# Patient Record
Sex: Female | Born: 1945 | Race: White | Hispanic: No | Marital: Single | Smoking: Current every day smoker
Health system: Southern US, Community
[De-identification: ages and names within clinical notes are randomized; demographics above are authoritative.]

## PROBLEM LIST (undated history)

## (undated) DIAGNOSIS — J449 Chronic obstructive pulmonary disease, unspecified: Secondary | ICD-10-CM

## (undated) DIAGNOSIS — Z8739 Personal history of other diseases of the musculoskeletal system and connective tissue: Secondary | ICD-10-CM

## (undated) DIAGNOSIS — Z9889 Other specified postprocedural states: Secondary | ICD-10-CM

## (undated) DIAGNOSIS — E119 Type 2 diabetes mellitus without complications: Secondary | ICD-10-CM

## (undated) DIAGNOSIS — C801 Malignant (primary) neoplasm, unspecified: Secondary | ICD-10-CM

---

## 2009-07-04 ENCOUNTER — Inpatient Hospital Stay (HOSPITAL_COMMUNITY): Admission: EM | Admit: 2009-07-04 | Discharge: 2009-07-07 | Payer: Self-pay | Admitting: Emergency Medicine

## 2009-07-06 ENCOUNTER — Encounter (INDEPENDENT_AMBULATORY_CARE_PROVIDER_SITE_OTHER): Payer: Self-pay | Admitting: Internal Medicine

## 2009-07-06 ENCOUNTER — Ambulatory Visit: Payer: Self-pay | Admitting: Vascular Surgery

## 2010-11-17 LAB — DIFFERENTIAL
Eosinophils Relative: 2 % (ref 0–5)
Lymphocytes Relative: 38 % (ref 12–46)
Monocytes Absolute: 0.7 10*3/uL (ref 0.1–1.0)
Monocytes Relative: 9 % (ref 3–12)
Neutro Abs: 4.1 10*3/uL (ref 1.7–7.7)

## 2010-11-17 LAB — COMPREHENSIVE METABOLIC PANEL
AST: 22 U/L (ref 0–37)
Albumin: 3.7 g/dL (ref 3.5–5.2)
BUN: 17 mg/dL (ref 6–23)
Creatinine, Ser: 1.19 mg/dL (ref 0.4–1.2)
GFR calc Af Amer: 56 mL/min — ABNORMAL LOW (ref >60)
Potassium: 3.7 mEq/L (ref 3.5–5.1)
Total Protein: 6.3 g/dL (ref 6.0–8.3)

## 2010-11-17 LAB — CBC
HCT: 37.1 % (ref 36.0–46.0)
Hemoglobin: 12.9 g/dL (ref 12.0–15.0)
MCHC: 34.3 g/dL (ref 30.0–36.0)
MCV: 93 fL (ref 78.0–100.0)
RDW: 12.6 % (ref 11.5–15.5)
RDW: 12.8 % (ref 11.5–15.5)
WBC: 8 10*3/uL (ref 4.0–10.5)

## 2010-11-17 LAB — GLUCOSE, CAPILLARY
Glucose-Capillary: 105 mg/dL — ABNORMAL HIGH (ref 70–99)
Glucose-Capillary: 106 mg/dL — ABNORMAL HIGH (ref 70–99)
Glucose-Capillary: 115 mg/dL — ABNORMAL HIGH (ref 70–99)
Glucose-Capillary: 85 mg/dL (ref 70–99)
Glucose-Capillary: 97 mg/dL (ref 70–99)

## 2010-11-17 LAB — HEPATIC FUNCTION PANEL
AST: 19 U/L (ref 0–37)
Albumin: 3.1 g/dL — ABNORMAL LOW (ref 3.5–5.2)
Alkaline Phosphatase: 69 U/L (ref 39–117)
Bilirubin, Direct: <0.1 mg/dL (ref 0.0–0.3)
Total Bilirubin: 0.5 mg/dL (ref 0.3–1.2)

## 2010-11-17 LAB — POCT I-STAT, CHEM 8
Chloride: 105 mEq/L (ref 96–112)
Hemoglobin: 13.3 g/dL (ref 12.0–15.0)
Potassium: 3.6 mEq/L (ref 3.5–5.1)
Sodium: 143 mEq/L (ref 135–145)

## 2010-11-17 LAB — CARDIAC PANEL(CRET KIN+CKTOT+MB+TROPI)
CK, MB: 2.3 ng/mL (ref 0.3–4.0)
Total CK: 98 U/L (ref 7–177)
Troponin I: 0.01 ng/mL (ref 0.00–0.06)
Troponin I: 0.03 ng/mL (ref 0.00–0.06)

## 2010-11-17 LAB — LIPID PANEL
Cholesterol: 183 mg/dL (ref 0–200)
LDL Cholesterol: 110 mg/dL — ABNORMAL HIGH (ref 0–99)
Total CHOL/HDL Ratio: 4.4 RATIO
Triglycerides: 157 mg/dL — ABNORMAL HIGH (ref <150)

## 2010-11-17 LAB — BASIC METABOLIC PANEL
CO2: 25 mEq/L (ref 19–32)
Calcium: 8.1 mg/dL — ABNORMAL LOW (ref 8.4–10.5)
Chloride: 106 mEq/L (ref 96–112)
Chloride: 112 mEq/L (ref 96–112)
Creatinine, Ser: 1.13 mg/dL (ref 0.4–1.2)
GFR calc Af Amer: >60 mL/min (ref >60)
Glucose, Bld: 105 mg/dL — ABNORMAL HIGH (ref 70–99)
Potassium: 3.8 mEq/L (ref 3.5–5.1)

## 2010-11-17 LAB — CK TOTAL AND CKMB (NOT AT ARMC)
CK, MB: 2.6 ng/mL (ref 0.3–4.0)
Relative Index: 2 (ref 0.0–2.5)
Relative Index: INVALID (ref 0.0–2.5)
Total CK: 97 U/L (ref 7–177)

## 2010-11-17 LAB — TROPONIN I: Troponin I: 0.02 ng/mL (ref 0.00–0.06)

## 2010-11-17 LAB — APTT: aPTT: 28 seconds (ref 24–37)

## 2015-12-12 ENCOUNTER — Inpatient Hospital Stay (HOSPITAL_COMMUNITY): Payer: Medicare Other | Admitting: Anesthesiology

## 2015-12-12 ENCOUNTER — Emergency Department (HOSPITAL_COMMUNITY): Payer: Medicare Other

## 2015-12-12 ENCOUNTER — Encounter (HOSPITAL_COMMUNITY): Payer: Self-pay | Admitting: General Surgery

## 2015-12-12 ENCOUNTER — Inpatient Hospital Stay (HOSPITAL_COMMUNITY)
Admission: EM | Admit: 2015-12-12 | Discharge: 2016-01-14 | DRG: 853 | Disposition: E | Payer: Medicare Other | Attending: Pulmonary Disease | Admitting: Pulmonary Disease

## 2015-12-12 ENCOUNTER — Inpatient Hospital Stay (HOSPITAL_COMMUNITY): Payer: Medicare Other

## 2015-12-12 ENCOUNTER — Encounter (HOSPITAL_COMMUNITY): Admission: EM | Disposition: E | Payer: Self-pay | Source: Home / Self Care | Attending: Pulmonary Disease

## 2015-12-12 DIAGNOSIS — E11649 Type 2 diabetes mellitus with hypoglycemia without coma: Secondary | ICD-10-CM | POA: Diagnosis not present

## 2015-12-12 DIAGNOSIS — Z66 Do not resuscitate: Secondary | ICD-10-CM | POA: Diagnosis not present

## 2015-12-12 DIAGNOSIS — J449 Chronic obstructive pulmonary disease, unspecified: Secondary | ICD-10-CM | POA: Diagnosis present

## 2015-12-12 DIAGNOSIS — J9811 Atelectasis: Secondary | ICD-10-CM | POA: Diagnosis not present

## 2015-12-12 DIAGNOSIS — R109 Unspecified abdominal pain: Secondary | ICD-10-CM | POA: Diagnosis present

## 2015-12-12 DIAGNOSIS — N179 Acute kidney failure, unspecified: Secondary | ICD-10-CM | POA: Insufficient documentation

## 2015-12-12 DIAGNOSIS — E876 Hypokalemia: Secondary | ICD-10-CM | POA: Diagnosis present

## 2015-12-12 DIAGNOSIS — E860 Dehydration: Secondary | ICD-10-CM | POA: Diagnosis present

## 2015-12-12 DIAGNOSIS — R160 Hepatomegaly, not elsewhere classified: Secondary | ICD-10-CM | POA: Diagnosis present

## 2015-12-12 DIAGNOSIS — M549 Dorsalgia, unspecified: Secondary | ICD-10-CM

## 2015-12-12 DIAGNOSIS — Z86018 Personal history of other benign neoplasm: Secondary | ICD-10-CM

## 2015-12-12 DIAGNOSIS — R34 Anuria and oliguria: Secondary | ICD-10-CM | POA: Diagnosis not present

## 2015-12-12 DIAGNOSIS — K7200 Acute and subacute hepatic failure without coma: Secondary | ICD-10-CM | POA: Diagnosis present

## 2015-12-12 DIAGNOSIS — K659 Peritonitis, unspecified: Secondary | ICD-10-CM | POA: Diagnosis present

## 2015-12-12 DIAGNOSIS — R6521 Severe sepsis with septic shock: Secondary | ICD-10-CM | POA: Diagnosis present

## 2015-12-12 DIAGNOSIS — K7290 Hepatic failure, unspecified without coma: Secondary | ICD-10-CM | POA: Diagnosis not present

## 2015-12-12 DIAGNOSIS — I248 Other forms of acute ischemic heart disease: Secondary | ICD-10-CM | POA: Diagnosis present

## 2015-12-12 DIAGNOSIS — I10 Essential (primary) hypertension: Secondary | ICD-10-CM | POA: Diagnosis present

## 2015-12-12 DIAGNOSIS — F17200 Nicotine dependence, unspecified, uncomplicated: Secondary | ICD-10-CM | POA: Diagnosis present

## 2015-12-12 DIAGNOSIS — A419 Sepsis, unspecified organism: Principal | ICD-10-CM | POA: Diagnosis present

## 2015-12-12 DIAGNOSIS — Z880 Allergy status to penicillin: Secondary | ICD-10-CM

## 2015-12-12 DIAGNOSIS — J432 Centrilobular emphysema: Secondary | ICD-10-CM | POA: Diagnosis not present

## 2015-12-12 DIAGNOSIS — Z9889 Other specified postprocedural states: Secondary | ICD-10-CM | POA: Diagnosis not present

## 2015-12-12 DIAGNOSIS — D649 Anemia, unspecified: Secondary | ICD-10-CM | POA: Diagnosis present

## 2015-12-12 DIAGNOSIS — K5669 Other intestinal obstruction: Secondary | ICD-10-CM

## 2015-12-12 DIAGNOSIS — J9601 Acute respiratory failure with hypoxia: Secondary | ICD-10-CM | POA: Insufficient documentation

## 2015-12-12 DIAGNOSIS — K56609 Unspecified intestinal obstruction, unspecified as to partial versus complete obstruction: Secondary | ICD-10-CM | POA: Insufficient documentation

## 2015-12-12 DIAGNOSIS — K55029 Acute infarction of small intestine, extent unspecified: Secondary | ICD-10-CM | POA: Diagnosis present

## 2015-12-12 DIAGNOSIS — Z01818 Encounter for other preprocedural examination: Secondary | ICD-10-CM

## 2015-12-12 DIAGNOSIS — Z515 Encounter for palliative care: Secondary | ICD-10-CM | POA: Diagnosis not present

## 2015-12-12 DIAGNOSIS — E861 Hypovolemia: Secondary | ICD-10-CM | POA: Diagnosis present

## 2015-12-12 DIAGNOSIS — E875 Hyperkalemia: Secondary | ICD-10-CM | POA: Diagnosis present

## 2015-12-12 DIAGNOSIS — Z8739 Personal history of other diseases of the musculoskeletal system and connective tissue: Secondary | ICD-10-CM

## 2015-12-12 DIAGNOSIS — K565 Intestinal adhesions [bands] with obstruction (postprocedural) (postinfection): Secondary | ICD-10-CM | POA: Diagnosis present

## 2015-12-12 DIAGNOSIS — Z452 Encounter for adjustment and management of vascular access device: Secondary | ICD-10-CM

## 2015-12-12 DIAGNOSIS — M314 Aortic arch syndrome [Takayasu]: Secondary | ICD-10-CM

## 2015-12-12 DIAGNOSIS — J9600 Acute respiratory failure, unspecified whether with hypoxia or hypercapnia: Secondary | ICD-10-CM

## 2015-12-12 DIAGNOSIS — E872 Acidosis, unspecified: Secondary | ICD-10-CM

## 2015-12-12 DIAGNOSIS — K912 Postsurgical malabsorption, not elsewhere classified: Secondary | ICD-10-CM | POA: Diagnosis not present

## 2015-12-12 DIAGNOSIS — R0989 Other specified symptoms and signs involving the circulatory and respiratory systems: Secondary | ICD-10-CM

## 2015-12-12 DIAGNOSIS — E119 Type 2 diabetes mellitus without complications: Secondary | ICD-10-CM

## 2015-12-12 DIAGNOSIS — J80 Acute respiratory distress syndrome: Secondary | ICD-10-CM | POA: Diagnosis not present

## 2015-12-12 DIAGNOSIS — Z8719 Personal history of other diseases of the digestive system: Secondary | ICD-10-CM

## 2015-12-12 DIAGNOSIS — C801 Malignant (primary) neoplasm, unspecified: Secondary | ICD-10-CM | POA: Diagnosis not present

## 2015-12-12 DIAGNOSIS — D6959 Other secondary thrombocytopenia: Secondary | ICD-10-CM | POA: Diagnosis not present

## 2015-12-12 DIAGNOSIS — K559 Vascular disorder of intestine, unspecified: Secondary | ICD-10-CM | POA: Diagnosis not present

## 2015-12-12 HISTORY — DX: Personal history of other benign neoplasm: Z86.018

## 2015-12-12 HISTORY — DX: Other specified postprocedural states: Z98.890

## 2015-12-12 HISTORY — DX: Type 2 diabetes mellitus without complications: E11.9

## 2015-12-12 HISTORY — DX: Personal history of other diseases of the musculoskeletal system and connective tissue: Z87.39

## 2015-12-12 HISTORY — DX: Malignant (primary) neoplasm, unspecified: C80.1

## 2015-12-12 HISTORY — DX: Chronic obstructive pulmonary disease, unspecified: J44.9

## 2015-12-12 HISTORY — PX: LAPAROTOMY: SHX154

## 2015-12-12 LAB — COMPREHENSIVE METABOLIC PANEL
ALK PHOS: 83 U/L (ref 38–126)
ALK PHOS: 91 U/L (ref 38–126)
ALT: 15 U/L (ref 14–54)
ALT: <5 U/L — ABNORMAL LOW (ref 14–54)
ANION GAP: 25 — AB (ref 5–15)
AST: 34 U/L (ref 15–41)
AST: 51 U/L — ABNORMAL HIGH (ref 15–41)
Albumin: 3.5 g/dL (ref 3.5–5.0)
Albumin: 3.1 g/dL — ABNORMAL LOW (ref 3.5–5.0)
Anion gap: 25 — ABNORMAL HIGH (ref 5–15)
BILIRUBIN TOTAL: 0.3 mg/dL (ref 0.3–1.2)
BUN: 32 mg/dL — ABNORMAL HIGH (ref 6–20)
BUN: 29 mg/dL — ABNORMAL HIGH (ref 6–20)
CALCIUM: 7.2 mg/dL — AB (ref 8.9–10.3)
CALCIUM: 8.4 mg/dL — AB (ref 8.9–10.3)
CHLORIDE: 111 mmol/L (ref 101–111)
CHLORIDE: 112 mmol/L — AB (ref 101–111)
CO2: 10 mmol/L — ABNORMAL LOW (ref 22–32)
CO2: 8 mmol/L — AB (ref 22–32)
CREATININE: 2.26 mg/dL — AB (ref 0.44–1.00)
Creatinine, Ser: 2.25 mg/dL — ABNORMAL HIGH (ref 0.44–1.00)
GFR calc non Af Amer: 21 mL/min — ABNORMAL LOW (ref >60)
GFR, EST AFRICAN AMERICAN: 24 mL/min — AB (ref >60)
GFR, EST AFRICAN AMERICAN: 24 mL/min — AB (ref >60)
GFR, EST NON AFRICAN AMERICAN: 21 mL/min — AB (ref >60)
Glucose, Bld: 281 mg/dL — ABNORMAL HIGH (ref 65–99)
Glucose, Bld: 297 mg/dL — ABNORMAL HIGH (ref 65–99)
Potassium: 3.4 mmol/L — ABNORMAL LOW (ref 3.5–5.1)
Potassium: 3.9 mmol/L (ref 3.5–5.1)
Sodium: 146 mmol/L — ABNORMAL HIGH (ref 135–145)
Sodium: 145 mmol/L (ref 135–145)
TOTAL PROTEIN: 6.5 g/dL (ref 6.5–8.1)
Total Bilirubin: 0.9 mg/dL (ref 0.3–1.2)
Total Protein: 5.7 g/dL — ABNORMAL LOW (ref 6.5–8.1)

## 2015-12-12 LAB — POCT I-STAT 3, ART BLOOD GAS (G3+)
ACID-BASE DEFICIT: 12 mmol/L — AB (ref 0.0–2.0)
ACID-BASE DEFICIT: 7 mmol/L — AB (ref 0.0–2.0)
Acid-base deficit: 13 mmol/L — ABNORMAL HIGH (ref 0.0–2.0)
Acid-base deficit: 11 mmol/L — ABNORMAL HIGH (ref 0.0–2.0)
BICARBONATE: 18.9 meq/L — AB (ref 20.0–24.0)
BICARBONATE: 17.8 meq/L — AB (ref 20.0–24.0)
BICARBONATE: 19.1 meq/L — AB (ref 20.0–24.0)
Bicarbonate: 17.1 mEq/L — ABNORMAL LOW (ref 20.0–24.0)
O2 SAT: 73 %
O2 SAT: 91 %
O2 SAT: 95 %
O2 Saturation: 98 %
PCO2 ART: 49 mmHg — AB (ref 35.0–45.0)
PCO2 ART: 37.9 mmHg (ref 35.0–45.0)
PH ART: 7.035 — AB (ref 7.350–7.450)
PH ART: 7.166 — AB (ref 7.350–7.450)
PH ART: 7.311 — AB (ref 7.350–7.450)
PO2 ART: 117 mmHg — AB (ref 80.0–100.0)
Patient temperature: 98.5
TCO2: 21 mmol/L (ref 0–100)
TCO2: 19 mmol/L (ref 0–100)
TCO2: 19 mmol/L (ref 0–100)
TCO2: 20 mmol/L (ref 0–100)
pCO2 arterial: 69.8 mmHg (ref 35.0–45.0)
pCO2 arterial: 44.8 mmHg (ref 35.0–45.0)
pH, Arterial: 7.174 — CL (ref 7.350–7.450)
pO2, Arterial: 54 mmHg — ABNORMAL LOW (ref 80.0–100.0)
pO2, Arterial: 68 mmHg — ABNORMAL LOW (ref 80.0–100.0)
pO2, Arterial: 97 mmHg (ref 80.0–100.0)

## 2015-12-12 LAB — POCT I-STAT, CHEM 8
BUN: 40 mg/dL — AB (ref 6–20)
CALCIUM ION: 0.93 mmol/L — AB (ref 1.13–1.30)
CHLORIDE: 117 mmol/L — AB (ref 101–111)
CREATININE: 1.5 mg/dL — AB (ref 0.44–1.00)
GLUCOSE: 113 mg/dL — AB (ref 65–99)
HCT: 46 % (ref 36.0–46.0)
Hemoglobin: 15.6 g/dL — ABNORMAL HIGH (ref 12.0–15.0)
POTASSIUM: 3.3 mmol/L — AB (ref 3.5–5.1)
Sodium: 152 mmol/L — ABNORMAL HIGH (ref 135–145)
TCO2: 21 mmol/L (ref 0–100)

## 2015-12-12 LAB — I-STAT CHEM 8, ED
BUN: 42 mg/dL — AB (ref 6–20)
CALCIUM ION: 0.94 mmol/L — AB (ref 1.13–1.30)
CHLORIDE: 109 mmol/L (ref 101–111)
CREATININE: 1.9 mg/dL — AB (ref 0.44–1.00)
Glucose, Bld: 327 mg/dL — ABNORMAL HIGH (ref 65–99)
HCT: 61 % — ABNORMAL HIGH (ref 36.0–46.0)
Hemoglobin: 20.7 g/dL — ABNORMAL HIGH (ref 12.0–15.0)
Potassium: 3.2 mmol/L — ABNORMAL LOW (ref 3.5–5.1)
SODIUM: 143 mmol/L (ref 135–145)
TCO2: 11 mmol/L (ref 0–100)

## 2015-12-12 LAB — PROTIME-INR
INR: 1.26 (ref 0.00–1.49)
PROTHROMBIN TIME: 16 s — AB (ref 11.6–15.2)

## 2015-12-12 LAB — LIPASE, BLOOD
LIPASE: 20 U/L (ref 11–51)
LIPASE: 20 U/L (ref 11–51)

## 2015-12-12 LAB — ABO/RH: ABO/RH(D): O POS

## 2015-12-12 LAB — I-STAT VENOUS BLOOD GAS, ED
ACID-BASE DEFICIT: 20 mmol/L — AB (ref 0.0–2.0)
Bicarbonate: 10.3 mEq/L — ABNORMAL LOW (ref 20.0–24.0)
O2 SAT: 50 %
TCO2: 11 mmol/L (ref 0–100)
pCO2, Ven: 39.1 mmHg — ABNORMAL LOW (ref 45.0–50.0)
pH, Ven: 7.027 — CL (ref 7.250–7.300)
pO2, Ven: 39 mmHg (ref 31.0–45.0)

## 2015-12-12 LAB — OCCULT BLOOD GASTRIC / DUODENUM (SPECIMEN CUP): Occult Blood, Gastric: POSITIVE — AB

## 2015-12-12 LAB — GLUCOSE, CAPILLARY
GLUCOSE-CAPILLARY: 116 mg/dL — AB (ref 65–99)
Glucose-Capillary: 101 mg/dL — ABNORMAL HIGH (ref 65–99)

## 2015-12-12 LAB — CBC
HCT: 56.9 % — ABNORMAL HIGH (ref 36.0–46.0)
HEMOGLOBIN: 18.3 g/dL — AB (ref 12.0–15.0)
MCH: 31 pg (ref 26.0–34.0)
MCHC: 32.2 g/dL (ref 30.0–36.0)
MCV: 96.3 fL (ref 78.0–100.0)
Platelets: 188 10*3/uL (ref 150–400)
RBC: 5.91 MIL/uL — AB (ref 3.87–5.11)
RDW: 13.2 % (ref 11.5–15.5)
WBC: 12.7 10*3/uL — AB (ref 4.0–10.5)

## 2015-12-12 LAB — I-STAT CG4 LACTIC ACID, ED
LACTIC ACID, VENOUS: 10.83 mmol/L — AB (ref 0.5–2.0)
Lactic Acid, Venous: 13 mmol/L (ref 0.5–2.0)

## 2015-12-12 LAB — I-STAT TROPONIN, ED: Troponin i, poc: 0.6 ng/mL (ref 0.00–0.08)

## 2015-12-12 LAB — PROCALCITONIN: Procalcitonin: 33.95 ng/mL

## 2015-12-12 LAB — MRSA PCR SCREENING: MRSA BY PCR: NEGATIVE

## 2015-12-12 LAB — TROPONIN I
TROPONIN I: 0.73 ng/mL — AB (ref <0.031)
Troponin I: 0.99 ng/mL (ref <0.031)
Troponin I: 0.78 ng/mL (ref <0.031)
Troponin I: 0.68 ng/mL (ref <0.031)

## 2015-12-12 LAB — PREPARE RBC (CROSSMATCH)

## 2015-12-12 LAB — LACTIC ACID, PLASMA: Lactic Acid, Venous: 3.2 mmol/L (ref 0.5–2.0)

## 2015-12-12 SURGERY — LAPAROTOMY, EXPLORATORY
Anesthesia: General | Site: Abdomen

## 2015-12-12 MED ORDER — FENTANYL CITRATE (PF) 250 MCG/5ML IJ SOLN
INTRAMUSCULAR | Status: AC
  Filled 2015-12-12: qty 5

## 2015-12-12 MED ORDER — SODIUM CHLORIDE 0.9 % IV SOLN
80.0000 mg | Freq: Once | INTRAVENOUS | Status: AC
  Administered 2015-12-12: 80 mg via INTRAVENOUS
  Filled 2015-12-12: qty 80

## 2015-12-12 MED ORDER — MIDAZOLAM HCL 2 MG/2ML IJ SOLN
INTRAMUSCULAR | Status: AC
  Filled 2015-12-12: qty 2

## 2015-12-12 MED ORDER — MIDAZOLAM HCL 2 MG/2ML IJ SOLN
INTRAMUSCULAR | Status: AC
  Filled 2015-12-12: qty 4

## 2015-12-12 MED ORDER — SODIUM CHLORIDE 0.9 % IV SOLN
8.0000 mg/h | INTRAVENOUS | Status: AC
  Administered 2015-12-12 – 2015-12-15 (×5): 8 mg/h via INTRAVENOUS
  Filled 2015-12-12 (×11): qty 80

## 2015-12-12 MED ORDER — ANTISEPTIC ORAL RINSE SOLUTION (CORINZ)
7.0000 mL | Freq: Four times a day (QID) | OROMUCOSAL | Status: DC
  Administered 2015-12-12 – 2015-12-17 (×20): 7 mL via OROMUCOSAL

## 2015-12-12 MED ORDER — SUCCINYLCHOLINE CHLORIDE 20 MG/ML IJ SOLN
INTRAMUSCULAR | Status: DC | PRN
  Administered 2015-12-12: 120 mg via INTRAVENOUS

## 2015-12-12 MED ORDER — ASPIRIN 300 MG RE SUPP: 300.0000 mg | RECTAL | Status: AC

## 2015-12-12 MED ORDER — ALBUTEROL SULFATE (2.5 MG/3ML) 0.083% IN NEBU: 2.5000 mg | INHALATION_SOLUTION | RESPIRATORY_TRACT | Status: DC | PRN

## 2015-12-12 MED ORDER — MIDAZOLAM HCL 5 MG/5ML IJ SOLN
INTRAMUSCULAR | Status: DC | PRN
  Administered 2015-12-12: 2 mg via INTRAVENOUS
  Administered 2015-12-12: 4 mg via INTRAVENOUS

## 2015-12-12 MED ORDER — MEPERIDINE HCL 25 MG/ML IJ SOLN: 6.2500 mg | INTRAMUSCULAR | Status: DC | PRN

## 2015-12-12 MED ORDER — VASOPRESSIN 20 UNIT/ML IV SOLN
INTRAVENOUS | Status: DC | PRN
  Administered 2015-12-12 (×3): 1 [IU] via INTRAVENOUS

## 2015-12-12 MED ORDER — LIDOCAINE HCL (CARDIAC) 20 MG/ML IV SOLN
INTRAVENOUS | Status: DC | PRN
  Administered 2015-12-12: 100 mg via INTRAVENOUS

## 2015-12-12 MED ORDER — SODIUM CHLORIDE 0.9 % IV SOLN
INTRAVENOUS | Status: DC
  Administered 2015-12-12: 12:00:00 via INTRAVENOUS

## 2015-12-12 MED ORDER — SODIUM CHLORIDE 0.9 % IV BOLUS (SEPSIS)
1000.0000 mL | Freq: Once | INTRAVENOUS | Status: AC
  Administered 2015-12-12: 1000 mL via INTRAVENOUS

## 2015-12-12 MED ORDER — 0.9 % SODIUM CHLORIDE (POUR BTL) OPTIME
TOPICAL | Status: DC | PRN
  Administered 2015-12-12: 2000 mL

## 2015-12-12 MED ORDER — FENTANYL CITRATE (PF) 100 MCG/2ML IJ SOLN
50.0000 ug | Freq: Once | INTRAMUSCULAR | Status: AC
  Administered 2015-12-12: 50 ug via INTRAVENOUS
  Filled 2015-12-12: qty 2

## 2015-12-12 MED ORDER — MIDAZOLAM HCL 2 MG/2ML IJ SOLN
1.0000 mg | INTRAMUSCULAR | Status: DC | PRN
  Administered 2015-12-13: 1 mg via INTRAVENOUS

## 2015-12-12 MED ORDER — SODIUM CHLORIDE 0.9 % IV SOLN
25.0000 ug/h | INTRAVENOUS | Status: DC
  Administered 2015-12-12: 25 ug/h via INTRAVENOUS
  Administered 2015-12-13 – 2015-12-15 (×2): 75 ug/h via INTRAVENOUS
  Administered 2015-12-17: 100 ug/h via INTRAVENOUS
  Filled 2015-12-12 (×4): qty 50

## 2015-12-12 MED ORDER — SODIUM BICARBONATE 8.4 % IV SOLN
INTRAVENOUS | Status: DC | PRN
  Administered 2015-12-12 (×3): 50 mL via INTRAVENOUS

## 2015-12-12 MED ORDER — DEXTROSE 5 % IV SOLN
1.0000 g | INTRAVENOUS | Status: DC
  Administered 2015-12-13 – 2015-12-15 (×3): 1 g via INTRAVENOUS
  Filled 2015-12-12 (×4): qty 1

## 2015-12-12 MED ORDER — ONDANSETRON HCL 4 MG/2ML IJ SOLN: 4.0000 mg | Freq: Once | INTRAMUSCULAR | Status: DC | PRN

## 2015-12-12 MED ORDER — VANCOMYCIN HCL IN DEXTROSE 750-5 MG/150ML-% IV SOLN
750.0000 mg | INTRAVENOUS | Status: DC
Start: 2015-12-13 — End: 2015-12-13
  Administered 2015-12-13: 750 mg via INTRAVENOUS
  Filled 2015-12-12: qty 150

## 2015-12-12 MED ORDER — HYDROMORPHONE HCL 1 MG/ML IJ SOLN: 0.2500 mg | INTRAMUSCULAR | Status: DC | PRN

## 2015-12-12 MED ORDER — SODIUM CHLORIDE 0.9 % IV BOLUS (SEPSIS): 1000.0000 mL | Freq: Once | INTRAVENOUS | Status: DC

## 2015-12-12 MED ORDER — FENTANYL CITRATE (PF) 100 MCG/2ML IJ SOLN
INTRAMUSCULAR | Status: AC
  Filled 2015-12-12: qty 2

## 2015-12-12 MED ORDER — MIDAZOLAM HCL 2 MG/2ML IJ SOLN
1.0000 mg | INTRAMUSCULAR | Status: DC | PRN
  Administered 2015-12-13 – 2015-12-16 (×3): 1 mg via INTRAVENOUS
  Filled 2015-12-12 (×4): qty 2

## 2015-12-12 MED ORDER — PHENYLEPHRINE HCL 10 MG/ML IJ SOLN
30.0000 ug/min | INTRAMUSCULAR | Status: DC
  Administered 2015-12-12 – 2015-12-13 (×2): 50 ug/min via INTRAVENOUS
  Administered 2015-12-13: 100 ug/min via INTRAVENOUS
  Administered 2015-12-13: 200 ug/min via INTRAVENOUS
  Administered 2015-12-14: 80 ug/min via INTRAVENOUS
  Administered 2015-12-14: 50 ug/min via INTRAVENOUS
  Administered 2015-12-15: 30 ug/min via INTRAVENOUS
  Filled 2015-12-12 (×8): qty 4

## 2015-12-12 MED ORDER — ONDANSETRON HCL 4 MG/2ML IJ SOLN: 4.0000 mg | Freq: Four times a day (QID) | INTRAMUSCULAR | Status: DC | PRN

## 2015-12-12 MED ORDER — ASPIRIN 81 MG PO CHEW: 324.0000 mg | CHEWABLE_TABLET | ORAL | Status: AC

## 2015-12-12 MED ORDER — SODIUM CHLORIDE 0.9 % IV SOLN
INTRAVENOUS | Status: DC
  Administered 2015-12-12: 125 mL/h via INTRAVENOUS
  Administered 2015-12-13: 03:00:00 via INTRAVENOUS

## 2015-12-12 MED ORDER — PROPOFOL 10 MG/ML IV BOLUS
INTRAVENOUS | Status: AC
  Filled 2015-12-12: qty 20

## 2015-12-12 MED ORDER — FENTANYL CITRATE (PF) 100 MCG/2ML IJ SOLN
50.0000 ug | Freq: Once | INTRAMUSCULAR | Status: AC
  Administered 2015-12-12: 50 ug via INTRAVENOUS

## 2015-12-12 MED ORDER — ONDANSETRON HCL 4 MG/2ML IJ SOLN
INTRAMUSCULAR | Status: AC
  Filled 2015-12-12: qty 2

## 2015-12-12 MED ORDER — ONDANSETRON HCL 4 MG/2ML IJ SOLN
4.0000 mg | Freq: Once | INTRAMUSCULAR | Status: AC
  Administered 2015-12-12: 4 mg via INTRAVENOUS

## 2015-12-12 MED ORDER — ROCURONIUM BROMIDE 100 MG/10ML IV SOLN
INTRAVENOUS | Status: DC | PRN
  Administered 2015-12-12: 50 mg via INTRAVENOUS

## 2015-12-12 MED ORDER — DEXTROSE 5 % IV SOLN
2.0000 ug/min | INTRAVENOUS | Status: DC
  Filled 2015-12-12 (×2): qty 16

## 2015-12-12 MED ORDER — CHLORHEXIDINE GLUCONATE 0.12% ORAL RINSE (MEDLINE KIT)
15.0000 mL | Freq: Two times a day (BID) | OROMUCOSAL | Status: DC
  Administered 2015-12-12 – 2015-12-16 (×9): 15 mL via OROMUCOSAL

## 2015-12-12 MED ORDER — SODIUM CHLORIDE 0.9 % IV SOLN
250.0000 mL | INTRAVENOUS | Status: DC | PRN
  Administered 2015-12-12 (×2): via INTRAVENOUS

## 2015-12-12 MED ORDER — INSULIN ASPART 100 UNIT/ML ~~LOC~~ SOLN
0.0000 [IU] | SUBCUTANEOUS | Status: DC
  Administered 2015-12-13 – 2015-12-15 (×2): 1 [IU] via SUBCUTANEOUS

## 2015-12-12 MED ORDER — FENTANYL CITRATE (PF) 100 MCG/2ML IJ SOLN: 50.0000 ug | Freq: Once | INTRAMUSCULAR | Status: DC

## 2015-12-12 MED ORDER — PHENYLEPHRINE HCL 10 MG/ML IJ SOLN
INTRAMUSCULAR | Status: DC | PRN
  Administered 2015-12-12: 120 ug via INTRAVENOUS

## 2015-12-12 MED ORDER — CEFEPIME HCL 1 G IJ SOLR
1.0000 g | INTRAMUSCULAR | Status: AC
Start: 2015-12-12 — End: 2015-12-12
  Administered 2015-12-12: 1 g via INTRAVENOUS
  Filled 2015-12-12: qty 1

## 2015-12-12 MED ORDER — HYDROMORPHONE HCL 1 MG/ML IJ SOLN
1.0000 mg | Freq: Once | INTRAMUSCULAR | Status: AC
  Administered 2015-12-12: 1 mg via INTRAVENOUS
  Filled 2015-12-12: qty 1

## 2015-12-12 MED ORDER — ROCURONIUM BROMIDE 50 MG/5ML IV SOLN
INTRAVENOUS | Status: AC
  Filled 2015-12-12: qty 2

## 2015-12-12 MED ORDER — VANCOMYCIN HCL IN DEXTROSE 1-5 GM/200ML-% IV SOLN
1000.0000 mg | Freq: Once | INTRAVENOUS | Status: AC
  Administered 2015-12-12: 1000 mg via INTRAVENOUS
  Filled 2015-12-12: qty 200

## 2015-12-12 MED ORDER — PANTOPRAZOLE SODIUM 40 MG IV SOLR
40.0000 mg | Freq: Two times a day (BID) | INTRAVENOUS | Status: DC
  Administered 2015-12-15: 40 mg via INTRAVENOUS
  Filled 2015-12-12 (×2): qty 40

## 2015-12-12 MED ORDER — PHENYLEPHRINE HCL 10 MG/ML IJ SOLN
30.0000 ug/min | INTRAVENOUS | Status: DC
  Administered 2015-12-12: 30 ug/min via INTRAVENOUS
  Filled 2015-12-12 (×2): qty 1

## 2015-12-12 MED ORDER — STERILE WATER FOR INJECTION IV SOLN
INTRAVENOUS | Status: DC
  Administered 2015-12-12 – 2015-12-13 (×2): via INTRAVENOUS
  Filled 2015-12-12 (×6): qty 850

## 2015-12-12 MED ORDER — FENTANYL CITRATE (PF) 100 MCG/2ML IJ SOLN: 25.0000 ug | INTRAMUSCULAR | Status: DC | PRN

## 2015-12-12 MED ORDER — FENTANYL CITRATE (PF) 250 MCG/5ML IJ SOLN
INTRAMUSCULAR | Status: DC | PRN
  Administered 2015-12-12 (×2): 50 ug via INTRAVENOUS
  Administered 2015-12-12: 125 ug via INTRAVENOUS
  Administered 2015-12-12: 25 ug via INTRAVENOUS

## 2015-12-12 MED ORDER — FENTANYL BOLUS VIA INFUSION
25.0000 ug | INTRAVENOUS | Status: DC | PRN
  Filled 2015-12-12: qty 25

## 2015-12-12 MED ORDER — PROPOFOL 10 MG/ML IV BOLUS
INTRAVENOUS | Status: DC | PRN
  Administered 2015-12-12: 80 mg via INTRAVENOUS

## 2015-12-12 MED ORDER — PHENYLEPHRINE HCL 10 MG/ML IJ SOLN
10.0000 mg | INTRAVENOUS | Status: DC | PRN
  Administered 2015-12-12: 60 ug/min via INTRAVENOUS
  Administered 2015-12-12: 40 ug/min via INTRAVENOUS

## 2015-12-12 SURGICAL SUPPLY — 40 items
BLADE SURG ROTATE 9660 (MISCELLANEOUS) IMPLANT
CANISTER SUCTION 2500CC (MISCELLANEOUS) ×3 IMPLANT
CANISTER WOUND CARE 500ML ATS (WOUND CARE) ×3 IMPLANT
COVER SURGICAL LIGHT HANDLE (MISCELLANEOUS) ×3 IMPLANT
DRAPE LAPAROSCOPIC ABDOMINAL (DRAPES) ×3 IMPLANT
DRAPE WARM FLUID 44X44 (DRAPE) ×3 IMPLANT
DRSG OPSITE POSTOP 4X10 (GAUZE/BANDAGES/DRESSINGS) IMPLANT
DRSG OPSITE POSTOP 4X8 (GAUZE/BANDAGES/DRESSINGS) IMPLANT
ELECT BLADE 6.5 EXT (BLADE) IMPLANT
ELECT CAUTERY BLADE 6.4 (BLADE) ×3 IMPLANT
ELECT REM PT RETURN 9FT ADLT (ELECTROSURGICAL) ×3
ELECTRODE REM PT RTRN 9FT ADLT (ELECTROSURGICAL) ×1 IMPLANT
GLOVE SURG SIGNA 7.5 PF LTX (GLOVE) ×3 IMPLANT
GOWN STRL REUS W/ TWL LRG LVL3 (GOWN DISPOSABLE) ×1 IMPLANT
GOWN STRL REUS W/ TWL XL LVL3 (GOWN DISPOSABLE) ×1 IMPLANT
GOWN STRL REUS W/TWL LRG LVL3 (GOWN DISPOSABLE) ×2
GOWN STRL REUS W/TWL XL LVL3 (GOWN DISPOSABLE) ×2
KIT BASIN OR (CUSTOM PROCEDURE TRAY) ×6 IMPLANT
KIT ROOM TURNOVER OR (KITS) ×3 IMPLANT
LIGASURE IMPACT 36 18CM CVD LR (INSTRUMENTS) ×3 IMPLANT
NS IRRIG 1000ML POUR BTL (IV SOLUTION) ×6 IMPLANT
PACK GENERAL/GYN (CUSTOM PROCEDURE TRAY) ×3 IMPLANT
PAD ARMBOARD 7.5X6 YLW CONV (MISCELLANEOUS) ×3 IMPLANT
RELOAD PROXIMATE 75MM BLUE (ENDOMECHANICALS) ×3 IMPLANT
SPECIMEN JAR LARGE (MISCELLANEOUS) ×3 IMPLANT
SPONGE ABDOMINAL VAC ABTHERA (MISCELLANEOUS) ×3 IMPLANT
SPONGE LAP 18X18 X RAY DECT (DISPOSABLE) IMPLANT
STAPLER PROXIMATE 75MM BLUE (STAPLE) ×3 IMPLANT
STAPLER VISISTAT 35W (STAPLE) IMPLANT
SUCTION POOLE TIP (SUCTIONS) ×3 IMPLANT
SUT PDS AB 1 TP1 96 (SUTURE) IMPLANT
SUT SILK 2 0 SH CR/8 (SUTURE) ×3 IMPLANT
SUT SILK 2 0 TIES 10X30 (SUTURE) ×3 IMPLANT
SUT SILK 3 0 SH CR/8 (SUTURE) ×3 IMPLANT
SUT SILK 3 0 TIES 10X30 (SUTURE) ×3 IMPLANT
SUT VIC AB 3-0 SH 18 (SUTURE) IMPLANT
TOWEL OR 17X24 6PK STRL BLUE (TOWEL DISPOSABLE) ×3 IMPLANT
TOWEL OR 17X26 10 PK STRL BLUE (TOWEL DISPOSABLE) ×3 IMPLANT
TRAY FOLEY CATH 16FRSI W/METER (SET/KITS/TRAYS/PACK) ×3 IMPLANT
YANKAUER SUCT BULB TIP NO VENT (SUCTIONS) IMPLANT

## 2015-12-12 NOTE — Progress Notes (Signed)
ETT advanced 2cm per MD order.  ETT around 19CM at lip and advanced to 21.  BBS heard well on auscultation.  RT will continue to monitor

## 2015-12-12 NOTE — Progress Notes (Signed)
eLink Physician-Brief Progress Note Patient Name: Breanna Warren DOB: [birth year] MRN: [id number]   Date of Service  11/27/2015  HPI/Events of Note  Camera check on patient with septic shock.  Low-dose Neo-Synephrine requirement.  Peak pressure 31 with respiratory rate 26. Still acidotic w/ pH 7.17 & pCO2 44. Sat 96% on FiO2 1.0 & PEEP 5.  Awaiting repeat LA. Patient sedated on low dose Fentanyl. Nurse at bedside. Reviewed pCXR which shows ETT 6cm above carina.   eICU Interventions  1. Advance ETT 2cm 2. Increase PEEP to 8cm H2O pressure 3. Increase RR to 30 bpm 4. Repeat ABG at 1830 hours 5. Goal Peak Pressure </= 35  6. Goal FiO2 </= 0.6 7. Goal pH >/= 7.20     Intervention Category Major Interventions: Sepsis - evaluation and management;Shock - evaluation and management;Respiratory failure - evaluation and management  Tera Partridge 12/02/2015, 4:34 PM

## 2015-12-12 NOTE — Op Note (Signed)
NAME:  TOMIKIA, FROBERG NO.:  [id number]  MEDICAL RECORD NO.:  [id number]  LOCATION:  [facility]                         FACILITY:  [facility]  PHYSICIAN:  Coralie Keens, M.D. DATE OF BIRTH:  09-13-1945  DATE OF PROCEDURE:  12/04/2015 DATE OF DISCHARGE:                              OPERATIVE REPORT   PREOPERATIVE DIAGNOSIS:  Acute abdomen.  POSTOPERATIVE DIAGNOSIS:  Infarcted small bowel from closed looped small bowel obstruction and ongoing ischemia.  PROCEDURE: 1. Exploratory laparotomy. 2. Small-bowel resection. 3. Placement of wound VAC device.  SURGEON:  Coralie Keens, M.D.  ASSISTANT:  Marland Kitchen T. Hoxworth, M.D.  ANESTHESIA:  General endotracheal anesthesia.  ESTIMATED BLOOD LOSS:  Minimal.  INDICATIONS:  This is a 70 year old female who presents with a 24-hour history of abdominal pain.  She was found to be diffusely tender on examination with a lactic acid level of 13 and base deficit of 20. Decision was made to proceed to the operating room for emergent laparotomy.  FINDINGS:  The patient was found to have an infarcted distal small bowel from what appeared to be a closed loop obstruction from a single adhesive band.  The rest of her small bowel was fairly ischemic as well. There was a palpable pulse in the superior mesenteric artery. Approximately, slightly more than 1 foot of small intestines was resected.  Again, the rest of her small bowel and some of her colon appeared dusky with multiple areas of patchy ischemia.  At this point, decision was made to place a wound VAC device and plan on return to the operating room in the next 24-48 hours to reassess the bowel for viability.  PROCEDURE IN DETAIL:  The patient was brought to the operating room, identified as Breanna Warren.  She was placed supine on the operating room table and general anesthesia was induced.  Her abdomen was then prepped and draped in usual sterile fashion.  I made a midline  incision through a previous scar with a scalpel.  I took this down to the fascia with electrocautery.  Upon entering the abdomen, a small serosal injury was created in the transverse colon which was stuck to the abdominal wall. The small bowel was evaluated and there was frank ischemia with infarction of the distal small bowel.  We were able to eviscerate the small bowel, and it became apparent that it was probably a closed looped obstruction from a single band that was lysed and we eviscerated the abdomen.  There was again grossly infarcted bowel which is almost a foot in length in the distal small bowel.  The rest of the small bowel was dusky with ischemic changes which were significant throughout.  I palpated the superior mesenteric artery and there was a pulse there.  At this point, we resected the infarcted small bowel by transecting proximal and distal with a GIA-75 stapler.  There was almost a foot of bowel distal remaining to the ligament of Treitz.  I then took down the mesentery with the LigaSure device.  The specimen was sent to Pathology for evaluation.  Again, there was significant ischemic changes on the rest of the small bowel going up to approximately 1 foot  past the ligament of Treitz.  The right colon and transverse colon appeared slightly dusky as well.  I repaired the serosal injury to the transverse colon with interrupted 3-0 silk sutures.  We then irrigated the abdomen with normal saline.  Hemostasis appeared to be achieved.  At this point, decision was made to place a wound VAC device.  The wound VAC was placed into the abdomen along with the two wound VAC sponges.  The occlusive dressing was then placed on top and the VAC was placed to suction.  PLAN:  Will be to return to the operating room in 24-48 hours for a second-look laparotomy to assess bowel viability.  She was left intubated and taken in a guarded condition from the operating room to the intensive care  unit.     Coralie Keens, M.D.     DB/MEDQ  D:  11/16/2015  T:  11/22/2015  Job:  [id number]
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
## 2015-12-12 NOTE — Progress Notes (Signed)
Dr. Ashok Cordia made aware of patient's minimal urine output, blood pressure, and pending ABG results. No new orders, will continue to monitor.

## 2015-12-12 NOTE — ED Notes (Signed)
Surgery at bedside.

## 2015-12-12 NOTE — Anesthesia Postprocedure Evaluation (Signed)
Anesthesia Post Note  Patient: [facility]  Procedure(s) Performed: Procedure(s) (LRB): EXPLORATORY LAPAROTOMY, SMALL BOWEL RESECTION WITH WOUND VAC PLACEMENT (N/A)  Patient location during evaluation: SICU Anesthesia Type: General Level of consciousness: sedated Pain management: pain level controlled Vital Signs Assessment: post-procedure vital signs reviewed and stable Respiratory status: patient remains intubated per anesthesia plan Cardiovascular status: stable Anesthetic complications: no    Last Vitals:  Filed Vitals:   12/03/2015 1800 12/07/2015 1900  BP: 91/72 84/62  Pulse: 116 120  Temp: 36.7 C   Resp: 24 20    Last Pain:  Filed Vitals:   12/06/2015 1932  PainSc: 10-Worst pain ever                 Kirstina Leinweber DAVID

## 2015-12-12 NOTE — Assessment & Plan Note (Signed)
Ongoing tobacco use since age 70

## 2015-12-12 NOTE — Progress Notes (Signed)
ANTIBIOTIC CONSULT NOTE  Pharmacy Consult for vancomycin Indication: sepsis  Allergies  Allergen Reactions  . Penicillins Other (See Comments)    "Puts me in another world"    Patient Measurements: Body Weight: ~ 110 pounds (per patient); ~ 50kg Body Height: ~ 63 inches (per patient)  Vital Signs: Temp: 97.1 F (36.2 C) (04/29 0749) Temp Source: Rectal (04/29 0749) BP: 135/75 mmHg (04/29 1058) Pulse Rate: 110 (04/29 1330)   Intake/Output from this shift: Total I/O In: 4600 [I.V.:4600] Out: 2100 [Emesis/NG output:1000; Drains:1000; Blood:100]  Labs:  Recent Labs  11/24/2015 0729  11/15/2015 0731 11/21/2015 0935 11/30/2015 1023 11/20/2015 1351  WBC 12.7*  --   --   --   --   --   HGB 18.3*  --  20.7*  --   --  15.6*  PLT 188  --   --   --   --   --   CREATININE  --   < > 1.90* 2.26* 2.25* 1.50*  < > = values in this interval not displayed. Microbiology: No results found for this or any previous visit (from the past 720 hour(s)).  Assessment: 70yo female  with c/o abdominal pain on cefepime for sepsis to now to begin vancomycin  -WBC= 12.7, afeb,SCr= 1.5, CrCl ~ 40 -vancomycin 1gm in ED at ~ 9:30am  Goal of Therapy:  Vancomycin trough= 15-20   Plan:  -Vancomycin 750mg  IV q24h -Will follow renal function, cultures and clinical progress  Hildred Laser, Pharm D 11/27/2015 2:52 PM

## 2015-12-12 NOTE — Progress Notes (Signed)
Late entry: 2 earrings and 2 rings given to son at bedside.

## 2015-12-12 NOTE — ED Notes (Signed)
Patient arrives with complaint of abdominal pain and chest pain. Onset was yesterday. Also endorses vomiting of blood. EMS initially reported BP at 60/20, distended abdomen, complaint of abdomen and back pain, and mottled skin.

## 2015-12-12 NOTE — Progress Notes (Signed)
ANTIBIOTIC CONSULT NOTE - INITIAL  Pharmacy Consult for Cefepime Indication:   Allergies  Allergen Reactions  . Penicillins Other (See Comments)    "Puts me in another world"    Patient Measurements:  Body Weight: ~ 110 pounds (per patient) Body Height: ~ 63 inches (per patient)  Vital Signs: Temp: 97.1 F (36.2 C) (04/29 0749) Temp Source: Rectal (04/29 0749) BP: 143/85 mmHg (04/29 0904) Pulse Rate: 111 (04/29 0904)   Intake/Output from this shift: Total I/O In: 1000 [I.V.:1000] Out: 2000 [Emesis/NG output:1000; Drains:1000]  Labs:  Recent Labs  11/25/2015 0729 11/21/2015 0731  WBC 12.7*  --   HGB 18.3* 20.7*  PLT 188  --   CREATININE  --  1.90*   Microbiology: No results found for this or any previous visit (from the past 720 hour(s)).  Assessment: 70yo female who appears much older, comes in with c/o abdominal pain ("like none I've ever had").  She confirms with me a weight of ~ 110 pounds and a height of ~ 61 inches.  I confirmed her allergy to penicillin and she states it "puts me out of this world" but denies anaphylaxis.  Her creatinine is 1.9 and she has an estimated crcl of ~ 26 ml/min using Cockcroft-Gault formula.  She has a lactic acid level of 13 and is being started on IV Cefepime for empiric antibiotic coverage.  We have been asked to assist in her dosing of this therapy.  She received one dose of IV Vancomycin without noted complications.  Goal of Therapy:  Therapeutic response to IV antibiotics  Plan:  Cefepime 1gm. IV every 24 hours Monitor renal function and adjust doses accordingly  Rober Minion, PharmD., MS Clinical Pharmacist Pager:  [phone] Thank you for allowing pharmacy to be part of this patients care team. 12/01/2015,9:38 AM

## 2015-12-12 NOTE — Op Note (Signed)
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  The most of her remaining small bowel had significant ischemic changes.  There was a palpable pulse in the SMA.  A wound VAC was placed and she was left open and intubated.  She will require a second look laparotomy in the next 24 to 48 hours.  Prognosis is poor.          [name] A   Date: 11/20/2015  Time: 12:31 PM

## 2015-12-12 NOTE — Anesthesia Procedure Notes (Addendum)
Central Venous Catheter Insertion Performed by: anesthesiologist 11/17/2015 12:09 PM Patient location: OR. Preanesthetic checklist: patient identified, IV checked, site marked, risks and benefits discussed, surgical consent, monitors and equipment checked, pre-op evaluation, timeout performed and anesthesia consent Position: Trendelenburg Landmarks identified Catheter size: 7.5 Fr Central line was placed.Double lumen Procedure performed using ultrasound guided technique. Attempts: 1 Following insertion, line sutured, dressing applied and Biopatch. Post procedure assessment: blood return through all ports, free fluid flow and no air. Patient tolerated the procedure well with no immediate complications.   Procedure Name: Intubation Date/Time: 11/21/2015 11:45 AM Performed by: Ollen Bowl Pre-anesthesia Checklist: Patient identified, Emergency Drugs available, Suction available, Patient being monitored and Timeout performed Patient Re-evaluated:Patient Re-evaluated prior to inductionOxygen Delivery Method: Circle system utilized and Simple face mask Preoxygenation: Pre-oxygenation with 100% oxygen Intubation Type: IV induction, Rapid sequence and Cricoid Pressure applied Laryngoscope Size: Miller and 2 Grade View: Grade I Tube type: Subglottic suction tube Tube size: 7.5 mm Number of attempts: 1 Airway Equipment and Method: Patient positioned with wedge pillow and Stylet Placement Confirmation: ETT inserted through vocal cords under direct vision,  positive ETCO2 and breath sounds checked- equal and bilateral Secured at: 18 cm Tube secured with: Tape Dental Injury: Teeth and Oropharynx as per pre-operative assessment

## 2015-12-12 NOTE — Progress Notes (Signed)
eLink Physician-Brief Progress Note Patient Name: Breanna [facility] DOB: [birth year] MRN: [id number]   Date of Service  11/21/2015  HPI/Events of Note  Nurse contacted regarding decreasing urine output. Continuing to require vasopressor support. Repeat ABG pending.  eICU Interventions  1. Nurse to notify if ABG worse 2. Continue vasopressor support for mean arterial pressure     Intervention Category Major Interventions: Shock - evaluation and management;Respiratory failure - evaluation and management;Acid-Base disturbance - evaluation and management  Tera Partridge 11/29/2015, 10:41 PM

## 2015-12-12 NOTE — Progress Notes (Signed)
Elink Dr. Ashok Cordia notified and made aware of pt's hypotension and ABG result. No new orders, will continue to monitor closely.

## 2015-12-12 NOTE — Progress Notes (Deleted)
Dr. Ashok Cordia made aware of patient's minimal urine output, blood pressure, and pending ABG results. No new orders, will continue to monitor.

## 2015-12-12 NOTE — ED Provider Notes (Signed)
CSN: [id number]     Arrival date & time 12/05/2015  [id number] History   First MD Initiated Contact with Patient 12/10/2015 [phone]     Chief Complaint  Patient presents with  . Hypotension  . Abdominal Pain     (Consider location/radiation/quality/duration/timing/severity/associated sxs/prior Treatment) HPI Comments: Level 5 caveat for acuity of condition.  Patient brought in by EMS with 24-hour history of abdominal pain, nausea, vomiting and diarrhea. EMS reports that she's had bloody diarrhea as well as blood-tinged emesis and coffee-ground emesis. She also has diffuse constant abdominal pain that's been ongoing for the past 24 hours. History of similar pain in the past which she relates to her "liver problems". Patient with a previous history of gallbladder removal. She is not an alcoholic per her report. She is not in any blood thinners. EMS reports initial blood pressure of 50 systolic. Patient is awake and alert with dry mucous membranes complaining of diffuse abdominal pain that radiates to her back. She denies any shortness of breath, fever, cough or rhinorrhea.  The history is provided by the patient and the EMS personnel. The history is limited by the condition of the patient.    No past medical history on file. No past surgical history on file. No family history on file. Social History  Substance Use Topics  . Smoking status: Not on file  . Smokeless tobacco: Not on file  . Alcohol Use: Not on file   OB History    No data available     Review of Systems  Unable to perform ROS: Acuity of condition  Gastrointestinal: Positive for abdominal pain.      Allergies  Penicillins  Home Medications   Prior to Admission medications   Not on File   BP 143/85 mmHg  Pulse 111  Temp(Src) 97.1 F (36.2 C) (Rectal)  Resp 16  SpO2 100% Physical Exam  Constitutional: She is oriented to person, place, and time. She appears well-developed and well-nourished. She appears distressed.  Dry  mucous membranes, awake and alert  HENT:  Head: Normocephalic and atraumatic.  Mouth/Throat: Oropharynx is clear and moist.  Eyes: Conjunctivae and EOM are normal. Pupils are equal, round, and reactive to light.  Neck: Normal range of motion. Neck supple.  Cardiovascular: Normal rate and normal heart sounds.   Tachycardic to 120s   Pulmonary/Chest: Effort normal and breath sounds normal. No respiratory distress.  Abdominal: Soft. There is tenderness. There is guarding.  Midline abdominal incision well-healed, diffuse tenderness, voluntary guarding throughout  Genitourinary:  Rectal exam shows no gross blood.  Musculoskeletal: Normal range of motion. She exhibits no edema or tenderness.  Neurological: She is alert and oriented to person, place, and time. No cranial nerve deficit. Coordination normal.    ED Course  Procedures (including critical care time) Labs Review Labs Reviewed  CBC - Abnormal; Notable for the following:    WBC 12.7 (*)    RBC 5.91 (*)    Hemoglobin 18.3 (*)    HCT 56.9 (*)    All other components within normal limits  I-STAT CHEM 8, ED - Abnormal; Notable for the following:    Potassium 3.2 (*)    BUN 42 (*)    Creatinine, Ser 1.90 (*)    Glucose, Bld 327 (*)    Calcium, Ion 0.94 (*)    Hemoglobin 20.7 (*)    HCT 61.0 (*)    All other components within normal limits  I-STAT TROPOININ, ED - Abnormal; Notable for the following:  Troponin i, poc 0.60 (*)    All other components within normal limits  I-STAT CG4 LACTIC ACID, ED - Abnormal; Notable for the following:    Lactic Acid, Venous 13.00 (*)    All other components within normal limits  CULTURE, BLOOD (ROUTINE X 2)  CULTURE, BLOOD (ROUTINE X 2)  URINALYSIS, ROUTINE W REFLEX MICROSCOPIC (NOT AT [facility])  PROTIME-INR  OCCULT BLOOD GASTRIC / DUODENUM (SPECIMEN CUP)  I-STAT CG4 LACTIC ACID, ED  POC OCCULT BLOOD, ED  POC OCCULT BLOOD, ED  TYPE AND SCREEN  ABO/RH    Imaging Review Ct Abdomen  Pelvis Wo Contrast  11/23/2015  CLINICAL DATA:  Abdominal pain. Chest pain. Hematemesis. Symptoms for 1 day. Hypotension. Distended abdomen. EXAM: CT CHEST, ABDOMEN AND PELVIS WITHOUT CONTRAST TECHNIQUE: Multidetector CT imaging of the chest, abdomen and pelvis was performed following the standard protocol without IV contrast. COMPARISON:  03/24/2009.  06/11/2007. FINDINGS: CT CHEST Esophagus is distended with fluid. There is no esophageal mass. The stomach is also distended. There are atherosclerotic changes involving the aortic arch, great vessels, and coronary arteries. No pericardial effusion.  No abnormal mediastinal adenopathy. Normal thyroid gland ORIF left clavicle.  No breakage or loosening of the hardware. Emphysema. Minimal dependent atelectasis in the left lower lobe. Hazy airspace opacities at the base of the right middle and dependent right lower lobe. There are some ground-glass opacities in the medial left upper lobe. See image 74 anteriorly. There is deep tendon hazy opacity in the left upper lobe. These findings likely represent volume loss. No pleural effusion.  No pneumothorax. Mild T7 superior endplate wedge compression deformity, minimal superior endplate depression at QA348G and T11 are noted. There is no prior imaging to correlate these fractures. There are age-indeterminate. There are no definite acute fracture lines in these have a chronic appearance. T12 compression fracture is stable compared with prior imaging. CT ABDOMEN AND PELVIS Stomach, proximal small bowel, and mid small bowel are all distended. Distal small bowel at in the region of the mid ileum is also distended with feculent material. There is a transition point to nondistended small bowel in the lower abdomen anteriorly just to the right of midline on image 89. There is no obvious mass or hernia. Distal small bowel and terminal ileum are decompressed. There is some stool and gas in the colon. No free intraperitoneal gas.  No  free intraperitoneal fluid. Small ventral hernia containing adipose tissue on image 69 is stable. There are hypodensities scattered throughout the liver. There is a right lobe liver lesion on image 53 that has enlarged and now measures 16 mm. Previously, it was less than 10 mm. Postcholecystectomy Calcified granulomata in the spleen. Pancreas and left adrenal gland are unremarkable. Stable right adrenal low-density adenoma. Atherosclerotic changes of the aorta without evidence of aneurysm. Bladder is decompressed. Uterus is absent. Adnexa are unremarkable. Severe L3 compression deformity is stable. No other lumbar compression fracture is identified. Mild facet arthropathy in the lower lumbar spine. IMPRESSION: Hazy and patchy airspace opacities in the lower right lung as described. Considering the esophagus is dilated. Aspiration pneumonia is not excluded. Hazy ground-glass in the left lung is felt to be related to volume loss. Partial small bowel obstruction pattern. The transition point is in the right lower abdomen anteriorly in the mid ileum. No obvious obstructing mass. Enlarging right lobe liver lesion which measures 16 mm. Contrast-enhanced MRI is recommended. Multiple compression fractures are likely chronic. Fractures in the lower thoracic spine and lumbar spine  are stable. There is no prior imaging for comparison of the mid thoracic spine compression fractures, but they have a chronic appearance. MRI can be performed to further delineate and better age fractures if desired. Postoperative changes. Electronically Signed   By: Marybelle Killings M.D.   On: 12/01/2015 08:49   Ct Chest Wo Contrast  11/20/2015  CLINICAL DATA:  Abdominal pain. Chest pain. Hematemesis. Symptoms for 1 day. Hypotension. Distended abdomen. EXAM: CT CHEST, ABDOMEN AND PELVIS WITHOUT CONTRAST TECHNIQUE: Multidetector CT imaging of the chest, abdomen and pelvis was performed following the standard protocol without IV contrast. COMPARISON:   03/24/2009.  06/11/2007. FINDINGS: CT CHEST Esophagus is distended with fluid. There is no esophageal mass. The stomach is also distended. There are atherosclerotic changes involving the aortic arch, great vessels, and coronary arteries. No pericardial effusion.  No abnormal mediastinal adenopathy. Normal thyroid gland ORIF left clavicle.  No breakage or loosening of the hardware. Emphysema. Minimal dependent atelectasis in the left lower lobe. Hazy airspace opacities at the base of the right middle and dependent right lower lobe. There are some ground-glass opacities in the medial left upper lobe. See image 74 anteriorly. There is deep tendon hazy opacity in the left upper lobe. These findings likely represent volume loss. No pleural effusion.  No pneumothorax. Mild T7 superior endplate wedge compression deformity, minimal superior endplate depression at QA348G and T11 are noted. There is no prior imaging to correlate these fractures. There are age-indeterminate. There are no definite acute fracture lines in these have a chronic appearance. T12 compression fracture is stable compared with prior imaging. CT ABDOMEN AND PELVIS Stomach, proximal small bowel, and mid small bowel are all distended. Distal small bowel at in the region of the mid ileum is also distended with feculent material. There is a transition point to nondistended small bowel in the lower abdomen anteriorly just to the right of midline on image 89. There is no obvious mass or hernia. Distal small bowel and terminal ileum are decompressed. There is some stool and gas in the colon. No free intraperitoneal gas.  No free intraperitoneal fluid. Small ventral hernia containing adipose tissue on image 69 is stable. There are hypodensities scattered throughout the liver. There is a right lobe liver lesion on image 53 that has enlarged and now measures 16 mm. Previously, it was less than 10 mm. Postcholecystectomy Calcified granulomata in the spleen. Pancreas  and left adrenal gland are unremarkable. Stable right adrenal low-density adenoma. Atherosclerotic changes of the aorta without evidence of aneurysm. Bladder is decompressed. Uterus is absent. Adnexa are unremarkable. Severe L3 compression deformity is stable. No other lumbar compression fracture is identified. Mild facet arthropathy in the lower lumbar spine. IMPRESSION: Hazy and patchy airspace opacities in the lower right lung as described. Considering the esophagus is dilated. Aspiration pneumonia is not excluded. Hazy ground-glass in the left lung is felt to be related to volume loss. Partial small bowel obstruction pattern. The transition point is in the right lower abdomen anteriorly in the mid ileum. No obvious obstructing mass. Enlarging right lobe liver lesion which measures 16 mm. Contrast-enhanced MRI is recommended. Multiple compression fractures are likely chronic. Fractures in the lower thoracic spine and lumbar spine are stable. There is no prior imaging for comparison of the mid thoracic spine compression fractures, but they have a chronic appearance. MRI can be performed to further delineate and better age fractures if desired. Postoperative changes. Electronically Signed   By: Marybelle Killings M.D.   On:  11/21/2015 08:49   Dg Chest Portable 1 View  12/07/2015  CLINICAL DATA:  Patient with abdominal pain and chest pain. EXAM: PORTABLE CHEST 1 VIEW COMPARISON:  Chest radiograph 01/19/2013. FINDINGS: Multiple monitoring leads overlie the patient. Stable cardiac and mediastinal contours. Postsurgical hardware left clavicle. No large area of pulmonary consolidation. No pleural effusion pneumothorax. IMPRESSION: Consider repeat evaluation with the patient's overlying lines removed. No acute cardiopulmonary process. Electronically Signed   By: Lovey Newcomer M.D.   On: 11/29/2015 07:45   Dg Abd Portable 1v  12/06/2015  CLINICAL DATA:  Onset of abdominal and chest pain yesterday, vomiting blood EXAM:  PORTABLE ABDOMEN - 1 VIEW COMPARISON:  None. FINDINGS: Mild gaseous distention of the stomach. No abnormally dilated loops of bowel. Multiple loops of small bowel in the left lower quadrant demonstrate evidence of wall thickening. IMPRESSION: There are findings suggesting small bowel wall thickening. Further investigation with contrast-enhanced CT of the abdomen and pelvis is suggested. Electronically Signed   By: Skipper Cliche M.D.   On: 12/10/2015 07:43   I have personally reviewed and evaluated these images and lab results as part of my medical decision-making.   EKG Interpretation   Date/Time:  Saturday December 12 2015 [id number] EDT Ventricular Rate:  112 PR Interval:  153 QRS Duration: 98 QT Interval:  367 QTC Calculation: 501 R Axis:   65 Text Interpretation:  Sinus tachycardia Right atrial enlargement Minimal  ST depression, lateral leads Prolonged QT interval Rate faster Nonspecific  ST abnormality Confirmed by Wyvonnia Dusky  MD, Mehran Guderian [phone]) on 11/29/2015  7:49:16 AM      MDM   Final diagnoses:  Lactic acidosis  Small bowel obstruction (HCC)  Acute renal failure, unspecified acute renal failure type [facility])   Patient with abdominal pain, nausea vomiting and diarrhea with bloody stools. Hypotensive initially for EMS. Blood pressure 113/75 on arrival. She is awake and alert but tachycardic. Abdomen is diffusely tender. No gross blood on rectal exam.  Bedside ultrasound does not show any AAA. No free air on abdominal film. Labs obtained. Lactate is 13. Concern for mesenteric ischemia versus perforated viscus. Anion gap.  IVF continued.  Creatinine 1.9. Discussed risks and benefits of IV contrast discussed with patient and she is agreeable to reduced dose contrast to assess for intra-abdominal pathology. Evaluate for mesenteric ischemia. NG tube placed with large output of pink/tan stomach contents about 2 L.  Decision made to proceed with CT without contrast given degree of pain  and poor IV access. CT scan shows partial small bowel obstruction with transition point in the right lower quadrant.  Discussed with PA Arnoldo Morale of Gen. Surgery. Recommends medical admission.  Patient given IV fluids, IV antibiotics, cultures obtained. She is afebrile. Dr. Ninfa Linden Plans to take patient to or for exploratory laparotomy. Discussed with critical care doctor Mannem.  Protecting airway and mental status stable at time of transfer to OR.   EMERGENCY DEPARTMENT [place] ABD/AORTA EXAM Study: Limited Ultrasound of the Abdominal Aorta.  INDICATIONS:Hypotension, Unstable Vital Signs, Abdominal pain, Back pain and Age>55 Indication: Multiple views of the abdominal aorta are obtained from the diaphragmatic hiatus to the aortic bifurcation in transverse and sagittal planes with a multi- Frequency probe.  PERFORMED BY: Myself  IMAGES ARCHIVED?: Yes  FINDINGS: Free fluid absent  LIMITATIONS:  Body habitus and Abdominal pain  INTERPRETATION:  No abdominal aortic aneurysm and Abdominal free fluid absent  COMMENT:     CRITICAL CARE Performed by: Ezequiel Essex Total critical care time: 79  minutes Critical care time was exclusive of separately billable procedures and treating other patients. Critical care was necessary to treat or prevent imminent or life-threatening deterioration. Critical care was time spent personally by me on the following activities: development of treatment plan with patient and/or surrogate as well as nursing, discussions with consultants, evaluation of patient's response to treatment, examination of patient, obtaining history from patient or surrogate, ordering and performing treatments and interventions, ordering and review of laboratory studies, ordering and review of radiographic studies, pulse oximetry and re-evaluation of patient's condition.   Ezequiel Essex, MD 11/24/2015 [phone]

## 2015-12-12 NOTE — Transfer of Care (Signed)
Immediate Anesthesia Transfer of Care Note  Patient: Breanna Warren  Procedure(s) Performed: Procedure(s): EXPLORATORY LAPAROTOMY, SMALL BOWEL RESECTION WITH WOUND VAC PLACEMENT (N/A)  Patient Location: SICU  Anesthesia Type:General  Level of Consciousness: Patient remains intubated per anesthesia plan  Airway & Oxygen Therapy: Patient remains intubated per anesthesia plan and Patient placed on Ventilator (see vital sign flow sheet for setting)  Post-op Assessment: Report given to RN and Post -op Vital signs reviewed and stable  Post vital signs: Reviewed and stable  Last Vitals:  Filed Vitals:   11/26/2015 1058 12/09/2015 1318  BP: 135/75   Pulse: 115 104  Temp:    Resp: 18 14    Last Pain:  Filed Vitals:   12/08/2015 1322  PainSc: 10-Worst pain ever         Complications: No apparent anesthesia complications

## 2015-12-12 NOTE — Progress Notes (Signed)
Patient ID: Breanna Warren, female   DOB: 25-Oct-1945, 70 y.o.   MRN: [id number]  Patient seen with the PA and examined  On exam, she has a diffusely tender abdomen with guarding throughout and peritoneal signs.  Given exam, elevated WBC, and elevated lactic acid level, she needs an emergent exploratory laparotomy. I discussed this with the patient and her family.  I discussed the risks which include but are not limited to bleeding, infection, need for bowel resection, need for ostomy, injury to surrounding structures, Need for further surgery, cardiopulmonary issues, DVT, prolonged intubation, death.  They agree to proceed.  Prognosis is poor.

## 2015-12-12 NOTE — Anesthesia Preprocedure Evaluation (Addendum)
Anesthesia Evaluation  Patient identified by MRN, date of birth, ID band Patient awake    Reviewed: Allergy & Precautions, NPO status , Patient's Chart, lab work & pertinent test results  Airway Mallampati: I  TM Distance: >3 FB Neck ROM: Full    Dental  (+) Edentulous Upper, Edentulous Lower, Dental Advisory Given   Pulmonary COPD, Current Smoker,    Pulmonary exam normal        Cardiovascular Normal cardiovascular exam     Neuro/Psych    GI/Hepatic   Endo/Other  diabetes, Type 2, Oral Hypoglycemic Agents  Renal/GU      Musculoskeletal   Abdominal   Peds  Hematology   Anesthesia Other Findings   Reproductive/Obstetrics                            Anesthesia Physical Anesthesia Plan  ASA: III and emergent  Anesthesia Plan: General   Post-op Pain Management:    Induction: Intravenous, Rapid sequence and Cricoid pressure planned  Airway Management Planned: Oral ETT  Additional Equipment:   Intra-op Plan:   Post-operative Plan: Possible Post-op intubation/ventilation  Informed Consent: I have reviewed the patients History and Physical, chart, labs and discussed the procedure including the risks, benefits and alternatives for the proposed anesthesia with the patient or authorized representative who has indicated his/her understanding and acceptance.     Plan Discussed with: CRNA and Surgeon  Anesthesia Plan Comments:        Anesthesia Quick Evaluation

## 2015-12-13 DIAGNOSIS — R6521 Severe sepsis with septic shock: Secondary | ICD-10-CM

## 2015-12-13 DIAGNOSIS — K659 Peritonitis, unspecified: Secondary | ICD-10-CM

## 2015-12-13 DIAGNOSIS — Z9889 Other specified postprocedural states: Secondary | ICD-10-CM

## 2015-12-13 DIAGNOSIS — N179 Acute kidney failure, unspecified: Secondary | ICD-10-CM

## 2015-12-13 DIAGNOSIS — K559 Vascular disorder of intestine, unspecified: Secondary | ICD-10-CM

## 2015-12-13 DIAGNOSIS — J9601 Acute respiratory failure with hypoxia: Secondary | ICD-10-CM

## 2015-12-13 DIAGNOSIS — A419 Sepsis, unspecified organism: Principal | ICD-10-CM

## 2015-12-13 LAB — POCT I-STAT 7, (LYTES, BLD GAS, ICA,H+H)
ACID-BASE DEFICIT: 12 mmol/L — AB (ref 0.0–2.0)
Acid-base deficit: 18 mmol/L — ABNORMAL HIGH (ref 0.0–2.0)
BICARBONATE: 18.2 meq/L — AB (ref 20.0–24.0)
Bicarbonate: 9.7 mEq/L — ABNORMAL LOW (ref 20.0–24.0)
CALCIUM ION: 0.8 mmol/L — AB (ref 1.13–1.30)
Calcium, Ion: 0.98 mmol/L — ABNORMAL LOW (ref 1.13–1.30)
HCT: 50 % — ABNORMAL HIGH (ref 36.0–46.0)
HEMATOCRIT: 32 % — AB (ref 36.0–46.0)
HEMOGLOBIN: 10.9 g/dL — AB (ref 12.0–15.0)
HEMOGLOBIN: 17 g/dL — AB (ref 12.0–15.0)
O2 SAT: 100 %
O2 Saturation: 93 %
PCO2 ART: 25.6 mmHg — AB (ref 35.0–45.0)
PH ART: 7.082 — AB (ref 7.350–7.450)
POTASSIUM: 3.7 mmol/L (ref 3.5–5.1)
Patient temperature: 35
Patient temperature: 35
Potassium: 4.6 mmol/L (ref 3.5–5.1)
SODIUM: 148 mmol/L — AB (ref 135–145)
Sodium: 145 mmol/L (ref 135–145)
TCO2: 11 mmol/L (ref 0–100)
TCO2: 20 mmol/L (ref 0–100)
pCO2 arterial: 59.3 mmHg (ref 35.0–45.0)
pH, Arterial: 7.176 — CL (ref 7.350–7.450)
pO2, Arterial: 289 mmHg — ABNORMAL HIGH (ref 80.0–100.0)
pO2, Arterial: 75 mmHg — ABNORMAL LOW (ref 80.0–100.0)

## 2015-12-13 LAB — BASIC METABOLIC PANEL
ANION GAP: 10 (ref 5–15)
Anion gap: 15 (ref 5–15)
BUN: 34 mg/dL — AB (ref 6–20)
BUN: 42 mg/dL — AB (ref 6–20)
CALCIUM: 5.3 mg/dL — AB (ref 8.9–10.3)
CHLORIDE: 117 mmol/L — AB (ref 101–111)
CHLORIDE: 108 mmol/L (ref 101–111)
CO2: 18 mmol/L — AB (ref 22–32)
CO2: 17 mmol/L — AB (ref 22–32)
CREATININE: 2.66 mg/dL — AB (ref 0.44–1.00)
Calcium: 5.4 mg/dL — CL (ref 8.9–10.3)
Creatinine, Ser: 3.32 mg/dL — ABNORMAL HIGH (ref 0.44–1.00)
GFR calc Af Amer: 20 mL/min — ABNORMAL LOW (ref >60)
GFR calc Af Amer: 15 mL/min — ABNORMAL LOW (ref >60)
GFR calc non Af Amer: 17 mL/min — ABNORMAL LOW (ref >60)
GFR calc non Af Amer: 13 mL/min — ABNORMAL LOW (ref >60)
GLUCOSE: 109 mg/dL — AB (ref 65–99)
Glucose, Bld: 127 mg/dL — ABNORMAL HIGH (ref 65–99)
POTASSIUM: 2.9 mmol/L — AB (ref 3.5–5.1)
POTASSIUM: 5 mmol/L (ref 3.5–5.1)
Sodium: 145 mmol/L (ref 135–145)
Sodium: 140 mmol/L (ref 135–145)

## 2015-12-13 LAB — GLUCOSE, CAPILLARY
GLUCOSE-CAPILLARY: 74 mg/dL (ref 65–99)
GLUCOSE-CAPILLARY: 89 mg/dL (ref 65–99)
Glucose-Capillary: 103 mg/dL — ABNORMAL HIGH (ref 65–99)
Glucose-Capillary: 127 mg/dL — ABNORMAL HIGH (ref 65–99)
Glucose-Capillary: 58 mg/dL — ABNORMAL LOW (ref 65–99)
Glucose-Capillary: 63 mg/dL — ABNORMAL LOW (ref 65–99)
Glucose-Capillary: 93 mg/dL (ref 65–99)
Glucose-Capillary: 95 mg/dL (ref 65–99)

## 2015-12-13 LAB — CBC
HEMATOCRIT: 41.3 % (ref 36.0–46.0)
Hemoglobin: 13.5 g/dL (ref 12.0–15.0)
MCH: 30.4 pg (ref 26.0–34.0)
MCHC: 32.7 g/dL (ref 30.0–36.0)
MCV: 93 fL (ref 78.0–100.0)
Platelets: 129 10*3/uL — ABNORMAL LOW (ref 150–400)
RBC: 4.44 MIL/uL (ref 3.87–5.11)
RDW: 13.4 % (ref 11.5–15.5)
WBC: 4.4 10*3/uL (ref 4.0–10.5)

## 2015-12-13 LAB — POCT I-STAT 3, ART BLOOD GAS (G3+)
Acid-base deficit: 10 mmol/L — ABNORMAL HIGH (ref 0.0–2.0)
Bicarbonate: 15.4 mEq/L — ABNORMAL LOW (ref 20.0–24.0)
O2 SAT: 97 %
PCO2 ART: 32.3 mmHg — AB (ref 35.0–45.0)
PH ART: 7.289 — AB (ref 7.350–7.450)
Patient temperature: 99
TCO2: 16 mmol/L (ref 0–100)
pO2, Arterial: 97 mmHg (ref 80.0–100.0)

## 2015-12-13 LAB — PHOSPHORUS: PHOSPHORUS: 3.7 mg/dL (ref 2.5–4.6)

## 2015-12-13 LAB — TROPONIN I: Troponin I: 1.05 ng/mL (ref <0.031)

## 2015-12-13 LAB — PROCALCITONIN: Procalcitonin: 115.02 ng/mL

## 2015-12-13 LAB — MAGNESIUM: Magnesium: 1.2 mg/dL — ABNORMAL LOW (ref 1.7–2.4)

## 2015-12-13 MED ORDER — LACTATED RINGERS IV BOLUS (SEPSIS)
500.0000 mL | Freq: Once | INTRAVENOUS | Status: AC
  Administered 2015-12-13: 500 mL via INTRAVENOUS

## 2015-12-13 MED ORDER — FLUCONAZOLE IN SODIUM CHLORIDE 400-0.9 MG/200ML-% IV SOLN
400.0000 mg | Freq: Once | INTRAVENOUS | Status: AC
  Administered 2015-12-13: 400 mg via INTRAVENOUS
  Filled 2015-12-13: qty 200

## 2015-12-13 MED ORDER — LACTATED RINGERS IV SOLN
INTRAVENOUS | Status: DC
  Administered 2015-12-13: 125 mL/h via INTRAVENOUS

## 2015-12-13 MED ORDER — FLUCONAZOLE IN SODIUM CHLORIDE 200-0.9 MG/100ML-% IV SOLN
200.0000 mg | INTRAVENOUS | Status: DC
  Administered 2015-12-14 – 2015-12-15 (×2): 200 mg via INTRAVENOUS
  Filled 2015-12-13 (×3): qty 100

## 2015-12-13 MED ORDER — METRONIDAZOLE IN NACL 5-0.79 MG/ML-% IV SOLN
500.0000 mg | Freq: Four times a day (QID) | INTRAVENOUS | Status: DC
  Administered 2015-12-13 – 2015-12-16 (×12): 500 mg via INTRAVENOUS
  Filled 2015-12-13 (×13): qty 100

## 2015-12-13 MED ORDER — SODIUM CHLORIDE 0.9 % IV SOLN
1.0000 g | Freq: Once | INTRAVENOUS | Status: AC
  Administered 2015-12-13: 1 g via INTRAVENOUS
  Filled 2015-12-13: qty 10

## 2015-12-13 MED ORDER — STERILE WATER FOR INJECTION IV SOLN
INTRAVENOUS | Status: DC
  Administered 2015-12-13: 14:00:00 via INTRAVENOUS
  Filled 2015-12-13 (×2): qty 1000

## 2015-12-13 MED ORDER — POTASSIUM CHLORIDE 10 MEQ/50ML IV SOLN
10.0000 meq | INTRAVENOUS | Status: AC
  Administered 2015-12-13 (×6): 10 meq via INTRAVENOUS
  Filled 2015-12-13 (×7): qty 50

## 2015-12-13 MED ORDER — MAGNESIUM SULFATE 4 GM/100ML IV SOLN
4.0000 g | Freq: Once | INTRAVENOUS | Status: AC
  Administered 2015-12-13: 4 g via INTRAVENOUS
  Filled 2015-12-13: qty 100

## 2015-12-13 MED ORDER — DEXTROSE 50 % IV SOLN
INTRAVENOUS | Status: AC
Start: 2015-12-13 — End: 2015-12-13
  Administered 2015-12-13: 50 mL
  Filled 2015-12-13: qty 50

## 2015-12-13 NOTE — Progress Notes (Signed)
eLink Physician-Brief Progress Note Patient Name: Megann Bartunek DOB: [birth year] MRN: [id number]   Date of Service  12/13/2015  HPI/Events of Note  Hypocalcemia Worsening renal failure hyperkalemia  eICU Interventions  Renal consult Replace Ca     Intervention Category Intermediate Interventions: Electrolyte abnormality - evaluation and management  Simonne Maffucci 12/13/2015, 4:05 PM

## 2015-12-13 NOTE — Progress Notes (Addendum)
ANTIBIOTIC CONSULT NOTE  Pharmacy Consult for vancomycin Indication: sepsis  Allergies  Allergen Reactions  . Penicillins Other (See Comments)    "Puts me in another world"    Patient Measurements: Body Weight: ~ 110 pounds (per patient); ~ 50kg Body Height: ~ 63 inches (per patient)  Vital Signs: Temp: 97.9 F (36.6 C) (04/30 0900) Temp Source: Axillary (04/30 0900) BP: 108/65 mmHg (04/30 1100) Pulse Rate: 95 (04/30 1100) 04/29 0701 - 04/30 0700 In: 9631.3 [I.V.:9571.3; NG/GT:60] Out: 4690 [Urine:565; Emesis/NG output:2100; Drains:1925; Blood:100] Intake/Output from this shift: Total I/O In: 1755.5 [I.V.:1115.5; NG/GT:30; IV Piggyback:610] Out: 365 [Urine:15; Emesis/NG output:200; Drains:150]  Labs:  Recent Labs  12/10/2015 0729  11/19/2015 1023  11/16/2015 1223 11/21/2015 1351 12/13/15 0350 12/13/15 0520  WBC 12.7*  --   --   --   --   --  4.4  --   HGB 18.3*  < >  --   < > 10.9* 15.6* 13.5  --   PLT 188  --   --   --   --   --  129*  --   CREATININE  --   < > 2.25*  --   --  1.50*  --  2.66*  < > = values in this interval not displayed. Microbiology: Recent Results (from the past 720 hour(s))  MRSA PCR Screening     Status: None   Collection Time: 11/25/2015  1:40 PM  Result Value Ref Range Status   MRSA by PCR NEGATIVE NEGATIVE Final    Comment:        The GeneXpert MRSA Assay (FDA approved for NASAL specimens only), is one component of a comprehensive MRSA colonization surveillance program. It is not intended to diagnose MRSA infection nor to guide or monitor treatment for MRSA infections.     Assessment: 70yo female  with c/o abdominal pain on cefepime/vancomycin . She is noted with ischemic bowel and s/p emergent laparotomy on 4/29. -WBC= 12.7, afeb,(1.9 at admit and 0.81 on 2010). CrCl ~ 15. UOP minimal. , CrCl ~15 -vancomycin 1gm in ED at ~ 9:30am  4/29 cefepime 4/29 vanc  4/29 blood x2 4/29 MRSA PCR- neg  Goal of Therapy:  Vancomycin trough=  15-20   Plan:  -No cefepime changes needed -Hold vancomycin dosing for now, will check a random level in am -Will follow renal function, cultures and clinical progress  Hildred Laser, Pharm D 12/13/2015 11:25 AM  Addendum: fluconazole -fluconazole added with concern of peritonitis  Plan -Fluconazole 400mg  IV x1 followed by 200mg  IV q24h  Hildred Laser, Pharm D 12/13/2015 2:30 PM

## 2015-12-13 NOTE — Progress Notes (Signed)
Utilization review completed.  

## 2015-12-13 NOTE — Progress Notes (Signed)
Progress Note: General Surgery Service   Subjective: Presented with septic shock, taken to OR with findings of dead bowel and ischemic bowel  Objective: Vital signs in last 24 hours: Temp:  [98 F (36.7 C)-100.1 F (37.8 C)] 99 F (37.2 C) (04/30 0409) Pulse Rate:  [95-120] 108 (04/30 0645) Resp:  [14-36] 32 (04/30 0645) BP: (70-143)/(47-104) 93/62 mmHg (04/30 0645) SpO2:  [86 %-100 %] 100 % (04/30 0645) Arterial Line BP: (55-157)/(46-115) 110/69 mmHg (04/30 0645) FiO2 (%):  [60 %-100 %] 60 % (04/30 0400) Weight:  [50 kg (110 lb 3.7 oz)-60 kg (132 lb 4.4 oz)] 60 kg (132 lb 4.4 oz) (04/30 0500) Last BM Date:  (Prior to surgery)  Intake/Output from previous day: 04/29 0701 - 04/30 0700 In: 9631.3 [I.V.:9571.3; NG/GT:60] Out: 24 [Urine:565; Emesis/NG output:2100; Drains:1925; Blood:100] Intake/Output this shift:    Lungs: coarse b/l, assisted ventilation  Cardiovascular: tachycardic  Abd: blue vac in place  Extremities: +edema  Neuro: intubated, sedated  Lab Results: CBC   Recent Labs  12/08/2015 0729  12/03/2015 1351 12/13/15 0350  WBC 12.7*  --   --  4.4  HGB 18.3*  < > 15.6* 13.5  HCT 56.9*  < > 46.0 41.3  PLT 188  --   --  129*  < > = values in this interval not displayed. BMET  Recent Labs  11/15/2015 1023 11/26/2015 1351 12/13/15 0520  NA 145 152* 145  K 3.9 3.3* 2.9*  CL 112* 117* 117*  CO2 8*  --  18*  GLUCOSE 297* 113* 127*  BUN 29* 40* 34*  CREATININE 2.25* 1.50* 2.66*  CALCIUM 7.2*  --  5.3*   PT/INR  Recent Labs  11/25/2015 0849  LABPROT 16.0*  INR 1.26   ABG  Recent Labs  11/25/2015 2243 12/13/15 0354  PHART 7.311* 7.289*  HCO3 19.1* 15.4*    Studies/Results:  Anti-infectives: Anti-infectives    Start     Dose/Rate Route Frequency Ordered Stop   12/13/15 0900  vancomycin (VANCOCIN) IVPB 750 mg/150 ml premix     750 mg 150 mL/hr over 60 Minutes Intravenous Every 24 hours 11/30/2015 1455     12/10/2015 1145  ceFEPIme (MAXIPIME) 1  g in dextrose 5 % 50 mL IVPB     1 g 100 mL/hr over 30 Minutes Intravenous STAT 12/01/2015 1136 12/09/2015 1150   12/03/2015 1000  ceFEPIme (MAXIPIME) 1 g in dextrose 5 % 50 mL IVPB     1 g 100 mL/hr over 30 Minutes Intravenous Every 24 hours 12/06/2015 0950     12/10/2015 0900  vancomycin (VANCOCIN) IVPB 1000 mg/200 mL premix     1,000 mg 200 mL/hr over 60 Minutes Intravenous  Once 11/17/2015 0857 11/15/2015 1022      Medications: Scheduled Meds: . antiseptic oral rinse  7 mL Mouth Rinse QID  . aspirin  324 mg Oral NOW   Or  . aspirin  300 mg Rectal NOW  . calcium gluconate  1 g Intravenous Once  . ceFEPime (MAXIPIME) IV  1 g Intravenous Q24H  . chlorhexidine gluconate (SAGE KIT)  15 mL Mouth Rinse BID  . fentaNYL (SUBLIMAZE) injection  50 mcg Intravenous Once  . insulin aspart  0-9 Units Subcutaneous Q4H  . magnesium sulfate 1 - 4 g bolus IVPB  4 g Intravenous Once  . [START ON 12/15/2015] pantoprazole (PROTONIX) IV  40 mg Intravenous Q12H  . potassium chloride  10 mEq Intravenous Q1 Hr x 6  . sodium chloride  1,000 mL Intravenous Once  . sodium chloride  1,000 mL Intravenous Once  . sodium chloride  1,000 mL Intravenous Once  . vancomycin  750 mg Intravenous Q24H   Continuous Infusions: . sodium chloride    . sodium chloride 125 mL/hr at 12/13/15 0700  . fentaNYL infusion INTRAVENOUS 75 mcg/hr (12/13/15 0700)  . norepinephrine (LEVOPHED) Adult infusion Stopped (12/13/15 0645)  . pantoprozole (PROTONIX) infusion 8 mg/hr (11/16/2015 0758)  . phenylephrine (NEO-SYNEPHRINE) Adult infusion 100 mcg/min (12/13/15 0700)  .  sodium bicarbonate 150 mEq in sterile water 1000 mL infusion 100 mL/hr at 12/13/15 0700   PRN Meds:.sodium chloride, albuterol, fentaNYL, fentaNYL (SUBLIMAZE) injection, midazolam, midazolam, ondansetron (ZOFRAN) IV  Assessment/Plan: Patient Active Problem List   Diagnosis Date Noted  . Diabetes mellitus ([place]) 12/07/2015  . H/O: GI bleed 12/11/2015  . Cancer ([place])  11/27/2015  . COPD (chronic obstructive pulmonary disease) ([place]) 12/02/2015  . H/O major abdominal surgery 12/06/2015  . Back pain 12/07/2015  . 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 Abdominal pain 11/21/2015   s/p Procedure(s): EXPLORATORY LAPAROTOMY, SMALL BOWEL RESECTION WITH WOUND VAC PLACEMENT 12/10/2015 WBC 13 to 4, creatinine increasing, troponin positive (likely demand ischemia), bicarb decreasing despite drip -continue antibiotics -continue cardiovascular support -will reassess, giving findings at time of exploration of ischemic bowel, increased time for resuscitation could help to salvage bowel, however, she is at very high risk for complete bowel ischemia and irreversible septic shock   LOS: 1 day   Mickeal Skinner, MD Pg# [phone] [facility]

## 2015-12-13 NOTE — Progress Notes (Signed)
PULMONARY / CRITICAL CARE MEDICINE   Name: Breanna Warren MRN: [id number] DOB: 03/14/1946    ADMISSION DATE:  12/02/2015 CONSULTATION DATE:  12/06/2015  REFERRING MD:  Dr. Wyvonnia Dusky   CHIEF COMPLAINT:  Abdominal Pain   BRIEF: 70 y/o female smoker admitted on 4/30 with ischemic bowel, septic shock, and renal failure who underwent emergent laparotomy on 4/29.   SUBJECTIVE:  Shock improved, acidosis improved   VITAL SIGNS: BP 105/54 mmHg  Pulse 95  Temp(Src) 97.9 F (36.6 C) (Axillary)  Resp 22  Ht 5\' 1"  (1.549 m)  Wt 60 kg (132 lb 4.4 oz)  BMI 25.01 kg/m2  SpO2 100%  HEMODYNAMICS: CVP:  [4 mmHg-16 mmHg] 7 mmHg  VENTILATOR SETTINGS: Vent Mode:  [-] PRVC FiO2 (%):  [60 %-70 %] 60 % Set Rate:  [26 bmp-30 bmp] 30 bmp Vt Set:  [400 mL] 400 mL PEEP:  [10 cmH20] 10 cmH20 Plateau Pressure:  [20 cmH20-27 cmH20] 20 cmH20  INTAKE / OUTPUT: I/O last 3 completed shifts: In: 9631.3 [I.V.:9571.3; NG/GT:60] Out: 4690 [Urine:565; Emesis/NG output:2100; Drains:1925; Blood:100]  PHYSICAL EXAMINATION: General:  On vent HENT : NCAT ETT in place PULM: CTA B, vent supported breaths CV: RRR  No mgr; R thumb/index finger dusky/cool, low cap refill GI: BS infrequent, wound vac in place Ext: warm, trace edema Neuro: Awake on vent, follows commands  LABS:  BMET  Recent Labs Lab 12/01/2015 0935 11/26/2015 1023  12/10/2015 1223 12/11/2015 1351 12/13/15 0520  NA 146* 145  < > 148* 152* 145  K 3.4* 3.9  < > 4.6 3.3* 2.9*  CL 111 112*  --   --  117* 117*  CO2 10* 8*  --   --   --  18*  BUN 32* 29*  --   --  40* 34*  CREATININE 2.26* 2.25*  --   --  1.50* 2.66*  GLUCOSE 281* 297*  --   --  113* 127*  < > = values in this interval not displayed.  Electrolytes  Recent Labs Lab 11/16/2015 0935 11/30/2015 1023 12/13/15 0520  CALCIUM 8.4* 7.2* 5.3*  MG  --   --  1.2*  PHOS  --   --  3.7    CBC  Recent Labs Lab 11/24/2015 0729  12/06/2015 1223 12/11/2015 1351 12/13/15 0350  WBC 12.7*  --    --   --  4.4  HGB 18.3*  < > 10.9* 15.6* 13.5  HCT 56.9*  < > 32.0* 46.0 41.3  PLT 188  --   --   --  129*  < > = values in this interval not displayed.  Coag's  Recent Labs Lab 12/06/2015 0849  INR 1.26    Sepsis Markers  Recent Labs Lab 12/02/2015 0732 11/27/2015 1005 12/01/2015 1540 12/13/15 0520  LATICACIDVEN 13.00* 10.83* 3.2*  --   PROCALCITON  --   --  33.95 115.02    ABG  Recent Labs Lab 12/07/2015 1854 11/22/2015 2243 12/13/15 0354  PHART 7.166* 7.311* 7.289*  PCO2ART 49.0* 37.9 32.3*  PO2ART 97.0 117.0* 97.0    Liver Enzymes  Recent Labs Lab 11/28/2015 0935 12/01/2015 1023  AST 34 51*  ALT 15 <5*  ALKPHOS 91 83  BILITOT 0.3 0.9  ALBUMIN 3.5 3.1*    Cardiac Enzymes  Recent Labs Lab 12/04/2015 1714 12/13/2015 2055 12/13/15 0520  TROPONINI 0.78* 0.68* 1.05*    Glucose  Recent Labs Lab 12/06/2015 1536 12/11/2015 2124 12/13/15 0018 12/13/15 0408 12/13/15 0735 12/13/15 1145  GLUCAP 116* 101* 93 74 103* 127*    Imaging Dg Chest Port 1 View  12/09/2015  CLINICAL DATA:  Endotracheal tube placement status post exploratory laparotomy. EXAM: PORTABLE CHEST 1 VIEW COMPARISON:  Same day. FINDINGS: The heart size and mediastinal contours are within normal limits. Nasogastric tube is seen entering the stomach. Endotracheal tube is seen projected over tracheal air shadow with distal tip 6 cm above the carina. Right internal jugular catheter is noted with distal tip overlying expected position of the SVC. No pneumothorax is noted. Interval development of bilateral basilar opacities are noted concerning for edema or atelectasis with associated pleural effusions. Status post surgical internal fixation of left clavicle. IMPRESSION: Endotracheal and nasogastric tubes in grossly good position. New bilateral basilar opacities are noted concerning for edema or atelectasis with associated pleural effusions. Electronically Signed   By: Marijo Conception, M.D.   On: 11/20/2015 14:50      STUDIES:  CT Chest w/o 4/29 >> hazy & patchy airspace opacities in RLL, ? Aspiration with dilated esophagus, volume loss LLL CT ABD/Pelvis w/o 4/29 >> partial small bowel obstruction, no obvious obstructing mass, enlarging R lobe liver lesion 16 mm  CULTURES: BCx2 4/29 >>  UA 4/29 >>   ANTIBIOTICS: Cefepime 4/29 >>  Vanc 4/29  >  Flagyl 4/29 >  Diflucan 4/29 >   SIGNIFICANT EVENTS: 4/29  Admit with abdominal pain, lactic acid 13, concern for SBO  4/29 Ex-lap: small bowel resection and wound VAC placed  LINES/TUBES:   DISCUSSION: [name] admitted with abdominal pain.  Found to have lactic acid of 13, CT concerning for SBO and AKI.  CCS evaluated and taken directly for exploratory laparotomy which showed ischemic bowel, left open post operatively.   ASSESSMENT / PLAN:  PULMONARY A: RLL Opacity  COPD - not defined, ongoing tobacco abuse Tobacco Abuse  P:   Full vent support Daily SBT/WUA/ABG/CXR Repeat ABG  CARDIOVASCULAR A:  Elevated Troponin - demand ischemia, EKG with non-specific ST wave changes Hx HTN  Septic shock R hand dusky> radial a-line in that hand P:  Bolus crystalloid now Continue neosynephrine Hold home lasix  F/U echo CVP monitoring Remove arterial line  RENAL A:   Acute Kidney Injury - in setting of SBO, hypovolemia with decreased intake Hypokalemia Anion Gap Acidosis / Lactic Acidosis  P:   Trend BMP / UOP  BMET now Repeat ABG Replace electrolytes as indicated  Use LR for fluid resuscitation today  GASTROINTESTINAL A:   Abdominal Pain - pending exploratory laparotomy  Suspected SBO  P:   NPO  PPI for SUP  Post-op care per CCS  HEMATOLOGIC A:   Leukocytosis  P:  SCD's for DVT prophylaxis  Hold heparin prophylaxis until cleared with CCS  INFECTIOUS A:   Peritonitis  RLL Opacity  P:   Cultures / abx as above Add flagyl/diflucan Trend lactic acid, PCT   ENDOCRINE A:   DM    P:   Q4 CBG with SSI    NEUROLOGIC A:   Pain  P:   RASS goal: -1,-2 fentanyl gtt per PAD protocol   FAMILY  - Updates: updated brother bedside.  Explained she remains critically ill.  - Inter-disciplinary family meet or Palliative Care meeting due by:   5/6  My cc time 40 minutes  Roselie Awkward, MD [facility] Pager: [phone] Cell: [phone] After 3pm or if no response, call [phone]

## 2015-12-13 NOTE — Progress Notes (Signed)
eLink Physician-Brief Progress Note Patient Name: Breanna Warren DOB: [birth year] MRN: [id number]   Date of Service  12/13/2015  HPI/Events of Note  Hypokalemia, hypomag, hypocalcemia  eICU Interventions  Potassium, mag, and calcium replaced     Intervention Category Intermediate Interventions: Electrolyte abnormality - evaluation and management  DETERDING,ELIZABETH 12/13/2015, 6:43 AM

## 2015-12-13 NOTE — Progress Notes (Signed)
Elink notified about morning labs: K+ 2.9, Calcium 5.3, Magnesium 1.2, Creatinine 2.66, and Troponin 1.05 - awaiting orders from MD for electrolyte replacement.
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
## 2015-12-14 ENCOUNTER — Inpatient Hospital Stay (HOSPITAL_COMMUNITY): Payer: Medicare Other | Admitting: Anesthesiology

## 2015-12-14 ENCOUNTER — Inpatient Hospital Stay (HOSPITAL_COMMUNITY): Payer: Medicare Other

## 2015-12-14 ENCOUNTER — Encounter (HOSPITAL_COMMUNITY): Admission: EM | Disposition: E | Payer: Self-pay | Source: Home / Self Care | Attending: Pulmonary Disease

## 2015-12-14 ENCOUNTER — Encounter (HOSPITAL_COMMUNITY): Payer: Self-pay | Admitting: Anesthesiology

## 2015-12-14 DIAGNOSIS — J80 Acute respiratory distress syndrome: Secondary | ICD-10-CM

## 2015-12-14 DIAGNOSIS — R0989 Other specified symptoms and signs involving the circulatory and respiratory systems: Secondary | ICD-10-CM

## 2015-12-14 DIAGNOSIS — J432 Centrilobular emphysema: Secondary | ICD-10-CM

## 2015-12-14 HISTORY — PX: BOWEL RESECTION: SHX1257

## 2015-12-14 HISTORY — PX: LAPAROTOMY: SHX154

## 2015-12-14 LAB — GLUCOSE, CAPILLARY
GLUCOSE-CAPILLARY: 100 mg/dL — AB (ref 65–99)
GLUCOSE-CAPILLARY: 105 mg/dL — AB (ref 65–99)
GLUCOSE-CAPILLARY: 44 mg/dL — AB (ref 65–99)
GLUCOSE-CAPILLARY: 49 mg/dL — AB (ref 65–99)
GLUCOSE-CAPILLARY: 58 mg/dL — AB (ref 65–99)
Glucose-Capillary: 40 mg/dL — CL (ref 65–99)
Glucose-Capillary: 79 mg/dL (ref 65–99)
Glucose-Capillary: 129 mg/dL — ABNORMAL HIGH (ref 65–99)
Glucose-Capillary: 85 mg/dL (ref 65–99)
Glucose-Capillary: 111 mg/dL — ABNORMAL HIGH (ref 65–99)
Glucose-Capillary: 67 mg/dL (ref 65–99)
Glucose-Capillary: 72 mg/dL (ref 65–99)

## 2015-12-14 LAB — URINALYSIS, ROUTINE W REFLEX MICROSCOPIC
Glucose, UA: NEGATIVE mg/dL
Ketones, ur: 15 mg/dL — AB
NITRITE: NEGATIVE
PH: 5 (ref 5.0–8.0)
Protein, ur: 100 mg/dL — AB
SPECIFIC GRAVITY, URINE: 1.024 (ref 1.005–1.030)

## 2015-12-14 LAB — BASIC METABOLIC PANEL
Anion gap: 16 — ABNORMAL HIGH (ref 5–15)
BUN: 53 mg/dL — AB (ref 6–20)
CALCIUM: 5.7 mg/dL — AB (ref 8.9–10.3)
CHLORIDE: 108 mmol/L (ref 101–111)
CO2: 17 mmol/L — AB (ref 22–32)
CREATININE: 4.07 mg/dL — AB (ref 0.44–1.00)
GFR calc non Af Amer: 10 mL/min — ABNORMAL LOW (ref >60)
GFR, EST AFRICAN AMERICAN: 12 mL/min — AB (ref >60)
Glucose, Bld: 100 mg/dL — ABNORMAL HIGH (ref 65–99)
Potassium: 4.9 mmol/L (ref 3.5–5.1)
SODIUM: 141 mmol/L (ref 135–145)

## 2015-12-14 LAB — URINE MICROSCOPIC-ADD ON

## 2015-12-14 LAB — CBC WITH DIFFERENTIAL/PLATELET
BASOS PCT: 0 %
Basophils Absolute: 0 10*3/uL (ref 0.0–0.1)
EOS ABS: 0 10*3/uL (ref 0.0–0.7)
EOS PCT: 0 %
HCT: 37.4 % (ref 36.0–46.0)
Hemoglobin: 12.2 g/dL (ref 12.0–15.0)
LYMPHS ABS: 0.8 10*3/uL (ref 0.7–4.0)
Lymphocytes Relative: 7 %
MCH: 30.6 pg (ref 26.0–34.0)
MCHC: 32.6 g/dL (ref 30.0–36.0)
MCV: 93.7 fL (ref 78.0–100.0)
MONO ABS: 0.3 10*3/uL (ref 0.1–1.0)
Monocytes Relative: 3 %
NEUTROS PCT: 90 %
Neutro Abs: 9.6 10*3/uL — ABNORMAL HIGH (ref 1.7–7.7)
PLATELETS: 74 10*3/uL — AB (ref 150–400)
RBC: 3.99 MIL/uL (ref 3.87–5.11)
RDW: 14 % (ref 11.5–15.5)
WBC Morphology: INCREASED
WBC: 10.7 10*3/uL — AB (ref 4.0–10.5)

## 2015-12-14 LAB — PHOSPHORUS: Phosphorus: 8 mg/dL — ABNORMAL HIGH (ref 2.5–4.6)

## 2015-12-14 LAB — POCT I-STAT 3, ART BLOOD GAS (G3+)
ACID-BASE DEFICIT: 11 mmol/L — AB (ref 0.0–2.0)
BICARBONATE: 15.9 meq/L — AB (ref 20.0–24.0)
O2 Saturation: 95 %
PO2 ART: 92 mmHg (ref 80.0–100.0)
Patient temperature: 98.7
TCO2: 17 mmol/L (ref 0–100)
pCO2 arterial: 38.6 mmHg (ref 35.0–45.0)
pH, Arterial: 7.225 — ABNORMAL LOW (ref 7.350–7.450)

## 2015-12-14 LAB — ECHOCARDIOGRAM COMPLETE
Height: 61 in
Weight: 2123.47 oz

## 2015-12-14 LAB — MAGNESIUM: Magnesium: 2.6 mg/dL — ABNORMAL HIGH (ref 1.7–2.4)

## 2015-12-14 LAB — VANCOMYCIN, TROUGH: Vancomycin Tr: 19 ug/mL (ref 10.0–20.0)

## 2015-12-14 LAB — PROCALCITONIN: PROCALCITONIN: 151.59 ng/mL

## 2015-12-14 SURGERY — LAPAROTOMY, EXPLORATORY
Anesthesia: General | Site: Abdomen

## 2015-12-14 MED ORDER — STERILE WATER FOR INJECTION IV SOLN
Freq: Once | INTRAVENOUS | Status: AC
  Administered 2015-12-14: 17:00:00 via INTRAVENOUS
  Filled 2015-12-14: qty 1000

## 2015-12-14 MED ORDER — STERILE WATER FOR INJECTION IV SOLN
INTRAVENOUS | Status: DC
  Filled 2015-12-14 (×2): qty 1000

## 2015-12-14 MED ORDER — ARTIFICIAL TEARS OP OINT
TOPICAL_OINTMENT | OPHTHALMIC | Status: DC | PRN
  Administered 2015-12-14: 1 via OPHTHALMIC

## 2015-12-14 MED ORDER — DEXTROSE 50 % IV SOLN
50.0000 mL | Freq: Once | INTRAVENOUS | Status: AC
  Administered 2015-12-14: 25 mL via INTRAVENOUS

## 2015-12-14 MED ORDER — DEXTROSE-NACL 5-0.45 % IV SOLN
INTRAVENOUS | Status: DC
  Administered 2015-12-14 – 2015-12-15 (×3): via INTRAVENOUS
  Administered 2015-12-16: 75 mL/h via INTRAVENOUS

## 2015-12-14 MED ORDER — STERILE WATER FOR INJECTION IV SOLN: INTRAVENOUS | Status: DC

## 2015-12-14 MED ORDER — LIDOCAINE HCL (CARDIAC) 20 MG/ML IV SOLN
INTRAVENOUS | Status: DC | PRN
  Administered 2015-12-14: 50 mg via INTRAVENOUS

## 2015-12-14 MED ORDER — 0.9 % SODIUM CHLORIDE (POUR BTL) OPTIME
TOPICAL | Status: DC | PRN
  Administered 2015-12-14 (×2): 1000 mL

## 2015-12-14 MED ORDER — VECURONIUM BROMIDE 10 MG IV SOLR
INTRAVENOUS | Status: DC | PRN
  Administered 2015-12-14: 5 mg via INTRAVENOUS

## 2015-12-14 MED ORDER — FENTANYL CITRATE (PF) 100 MCG/2ML IJ SOLN
INTRAMUSCULAR | Status: DC | PRN
  Administered 2015-12-14 (×2): 100 ug via INTRAVENOUS
  Administered 2015-12-14: 50 ug via INTRAVENOUS

## 2015-12-14 MED ORDER — SODIUM CHLORIDE 0.9 % IV SOLN
1.0000 g | Freq: Once | INTRAVENOUS | Status: AC
  Administered 2015-12-14: 1 g via INTRAVENOUS
  Filled 2015-12-14: qty 10

## 2015-12-14 MED ORDER — FENTANYL CITRATE (PF) 250 MCG/5ML IJ SOLN
INTRAMUSCULAR | Status: AC
  Filled 2015-12-14: qty 5

## 2015-12-14 MED ORDER — VANCOMYCIN HCL IN DEXTROSE 1-5 GM/200ML-% IV SOLN
1000.0000 mg | INTRAVENOUS | Status: DC
  Administered 2015-12-14 – 2015-12-15 (×3): 1000 mg via INTRAVENOUS
  Filled 2015-12-14 (×3): qty 200

## 2015-12-14 SURGICAL SUPPLY — 45 items
BLADE SURG ROTATE 9660 (MISCELLANEOUS) IMPLANT
CANISTER SUCTION 2500CC (MISCELLANEOUS) ×3 IMPLANT
CANISTER WOUND CARE 500ML ATS (WOUND CARE) ×3 IMPLANT
CHLORAPREP W/TINT 26ML (MISCELLANEOUS) ×3 IMPLANT
COVER SURGICAL LIGHT HANDLE (MISCELLANEOUS) ×3 IMPLANT
DRAPE LAPAROSCOPIC ABDOMINAL (DRAPES) ×3 IMPLANT
DRAPE WARM FLUID 44X44 (DRAPE) ×3 IMPLANT
DRSG OPSITE POSTOP 4X10 (GAUZE/BANDAGES/DRESSINGS) IMPLANT
DRSG OPSITE POSTOP 4X8 (GAUZE/BANDAGES/DRESSINGS) IMPLANT
ELECT BLADE 6.5 EXT (BLADE) IMPLANT
ELECT CAUTERY BLADE 6.4 (BLADE) ×6 IMPLANT
ELECT REM PT RETURN 9FT ADLT (ELECTROSURGICAL) ×3
ELECTRODE REM PT RTRN 9FT ADLT (ELECTROSURGICAL) ×1 IMPLANT
GLOVE BIO SURGEON STRL SZ 6.5 (GLOVE) ×2 IMPLANT
GLOVE BIO SURGEON STRL SZ7 (GLOVE) ×3 IMPLANT
GLOVE BIO SURGEONS STRL SZ 6.5 (GLOVE) ×1
GLOVE BIOGEL PI IND STRL 7.0 (GLOVE) ×1 IMPLANT
GLOVE BIOGEL PI IND STRL 7.5 (GLOVE) ×1 IMPLANT
GLOVE BIOGEL PI INDICATOR 7.0 (GLOVE) ×2
GLOVE BIOGEL PI INDICATOR 7.5 (GLOVE) ×2
GLOVE SURG SIGNA 7.5 PF LTX (GLOVE) ×3 IMPLANT
GOWN STRL REUS W/ TWL LRG LVL3 (GOWN DISPOSABLE) ×2 IMPLANT
GOWN STRL REUS W/TWL LRG LVL3 (GOWN DISPOSABLE) ×4
KIT BASIN OR (CUSTOM PROCEDURE TRAY) ×3 IMPLANT
KIT ROOM TURNOVER OR (KITS) ×3 IMPLANT
LIGASURE IMPACT 36 18CM CVD LR (INSTRUMENTS) ×3 IMPLANT
NS IRRIG 1000ML POUR BTL (IV SOLUTION) ×6 IMPLANT
PACK GENERAL/GYN (CUSTOM PROCEDURE TRAY) ×3 IMPLANT
PAD ARMBOARD 7.5X6 YLW CONV (MISCELLANEOUS) ×3 IMPLANT
RELOAD PROXIMATE 75MM BLUE (ENDOMECHANICALS) ×3 IMPLANT
SPECIMEN JAR LARGE (MISCELLANEOUS) IMPLANT
SPONGE ABDOMINAL VAC ABTHERA (MISCELLANEOUS) ×3 IMPLANT
SPONGE LAP 18X18 X RAY DECT (DISPOSABLE) IMPLANT
STAPLER PROXIMATE 75MM BLUE (STAPLE) ×3 IMPLANT
STAPLER VISISTAT 35W (STAPLE) ×3 IMPLANT
SUCTION POOLE TIP (SUCTIONS) ×3 IMPLANT
SUT PDS AB 1 TP1 96 (SUTURE) ×6 IMPLANT
SUT SILK 2 0 SH CR/8 (SUTURE) ×3 IMPLANT
SUT SILK 2 0 TIES 10X30 (SUTURE) ×3 IMPLANT
SUT SILK 3 0 SH CR/8 (SUTURE) ×3 IMPLANT
SUT SILK 3 0 TIES 10X30 (SUTURE) ×3 IMPLANT
SUT VIC AB 3-0 SH 18 (SUTURE) IMPLANT
TOWEL OR 17X26 10 PK STRL BLUE (TOWEL DISPOSABLE) ×3 IMPLANT
TRAY FOLEY CATH 16FRSI W/METER (SET/KITS/TRAYS/PACK) IMPLANT
YANKAUER SUCT BULB TIP NO VENT (SUCTIONS) IMPLANT

## 2015-12-14 NOTE — Progress Notes (Signed)
Dr. Florene Glen paged to inquire about continuous IVF order added, pt currently getting IVF @ 100, per MD okay to infuse new order of IVF for 1L only, will continue to monitor.  Mendel Corning 4:08 PM

## 2015-12-14 NOTE — Progress Notes (Signed)
eLink Physician-Brief Progress Note Patient Name: Zayuri Militello DOB: Feb 03, 1946 MRN: [id number]   Date of Service  01/02/2016  HPI/Events of Note  Notified by bedside nurse of hypocalcemia. Previous ionized calcium less than 1.0. Previously received 2 g IV calcium gluconate. Magnesium 1.2 yesterday morning as well. Currently with acute renal failure.  eICU Interventions  1. Add on magnesium & phosphorus to a.m. Blood draw 2. Ionized calcium now 3. Calcium gluconate 1 g IV now     Intervention Category Major Interventions: Electrolyte abnormality - evaluation and management;Acute renal failure - evaluation and management  Tera Partridge 12/16/2015, 5:01 AM

## 2015-12-14 NOTE — Transfer of Care (Signed)
Immediate Anesthesia Transfer of Care Note  Patient: Breanna Warren  Procedure(s) Performed: Procedure(s): EXPLORATORY LAPAROTOMY (N/A) SMALL BOWEL RESECTION (N/A)  Patient Location: SICU  Anesthesia Type:General  Level of Consciousness: sedated and Patient remains intubated per anesthesia plan  Airway & Oxygen Therapy: Patient remains intubated per anesthesia plan and Patient placed on Ventilator (see vital sign flow sheet for setting)  Post-op Assessment: Report given to RN and Post -op Vital signs reviewed and stable  Post vital signs: Reviewed and stable  Last Vitals:  Filed Vitals:   12/18/2015 0738 01/07/2016 0753  BP:  97/55  Pulse:  99  Temp: 36.9 C   Resp:  32    Last Pain:  Filed Vitals:   12/29/2015 1126  PainSc: 10-Worst pain ever         Complications: No apparent anesthesia complications

## 2015-12-14 NOTE — Progress Notes (Signed)
CRITICAL VALUE ALERT  Critical value received: Calcium 5.7  Date of notification:  01/08/2016  Time of notification:  0500  Critical value read back:Yes.    Nurse who received alert:  Wyline Beady, RN  MD notified (1st page):  Alfred Levins  Time of first page:  0500  MD notified (2nd page):  Time of second page:  Responding MD:  Alfred Levins  Time MD responded:  28

## 2015-12-14 NOTE — Progress Notes (Signed)
  Echocardiogram 2D Echocardiogram has been performed.  Jennette Dubin 01/06/2016, 10:47 AM

## 2015-12-14 NOTE — Anesthesia Postprocedure Evaluation (Signed)
Anesthesia Post Note  Patient: Breanna Warren  Procedure(s) Performed: Procedure(s) (LRB): EXPLORATORY LAPAROTOMY (N/A) SMALL BOWEL RESECTION (N/A)  Patient location during evaluation: ICU Anesthesia Type: General Level of consciousness: patient remains intubated per anesthesia plan Vital Signs Assessment: post-procedure vital signs reviewed and stable Respiratory status: patient remains intubated per anesthesia plan Cardiovascular status: blood pressure returned to baseline Anesthetic complications: no    Last Vitals:  Filed Vitals:   12/30/2015 1000 12/27/2015 1205  BP: 119/54 110/48  Pulse: 95 88  Temp:    Resp: 26 30    Last Pain:  Filed Vitals:   01/06/2016 1208  PainSc: 10-Worst pain ever                 Javon Hupfer, Maram JENNETTE

## 2015-12-14 NOTE — Progress Notes (Signed)
VASCULAR LAB PRELIMINARY  PRELIMINARY  PRELIMINARY  PRELIMINARY  Right lower extremity arterial duplex completed.    Preliminary report:  Right:  Monophasic waveforms from CFA through the distal PTA, pero a, and ATA.  Loss of diastolic component in the distal pop a and tibial vessels.  No evidence of stenosis or occlusion.  Left:  Monophasic waveforms at the CFA and distal PTA similar in velocity and form to the right.  Laberta Wilbon, RVT 12/22/2015, 5:36 PM

## 2015-12-14 NOTE — Progress Notes (Signed)
PULMONARY / CRITICAL CARE MEDICINE   Name: Breanna Warren MRN: [id number] DOB: 1945-10-13    ADMISSION DATE:  11/26/2015 CONSULTATION DATE:  12/03/2015  REFERRING MD:  Dr. Wyvonnia Dusky   CHIEF COMPLAINT:  Abdominal Pain    SUBJECTIVE:  RN reports concern regarding breathing pattern.  Remains on 80 mcg neo, fentanyl at 75.  Planned return to OR for wash out pending consent per CCS.  80 ml urine output in last 24 hours.    VITAL SIGNS: BP 100/54 mmHg  Pulse 98  Temp(Src) 98.5 F (36.9 C) (Oral)  Resp 26  Ht 5\' 1"  (1.549 m)  Wt 132 lb 11.5 oz (60.2 kg)  BMI 25.09 kg/m2  SpO2 100%  HEMODYNAMICS: CVP:  [7 mmHg-10 mmHg] 7 mmHg  VENTILATOR SETTINGS: Vent Mode:  [-] PRVC FiO2 (%):  [50 %-60 %] 50 % Set Rate:  [30 bmp] 30 bmp Vt Set:  [400 mL] 400 mL PEEP:  [8 cmH20-10 cmH20] 8 cmH20 Plateau Pressure:  [15 cmH20-21 cmH20] 21 cmH20  INTAKE / OUTPUT: I/O last 3 completed shifts: In: 9948.2 [I.V.:7888.2; NG/GT:180; IV Piggyback:1880] Out: 3095 [Urine:145; Emesis/NG output:1700; Drains:1250]  PHYSICAL EXAMINATION: General:  Small adult female on vent, no acute distress HENT : NCAT, ETT in place PULM: CTA B, vent supported breaths CV: RRR  No m/r/g, R thumb/index finger dusky/cool, low cap refill GI: BS infrequent, wound vac in place Ext: warm, trace edema Neuro: sedated Skin:  Dusky toes bilaterally with cap refill <4sec  LABS:  BMET  Recent Labs Lab 12/13/15 0520 12/13/15 1513 01/03/2016 0350  NA 145 140 141  K 2.9* 5.0 4.9  CL 117* 108 108  CO2 18* 17* 17*  BUN 34* 42* 53*  CREATININE 2.66* 3.32* 4.07*  GLUCOSE 127* 109* 100*    Electrolytes  Recent Labs Lab 12/13/15 0520 12/13/15 1513 01/10/2016 0350 12/25/2015 0514  CALCIUM 5.3* 5.4* 5.7*  --   MG 1.2*  --   --  2.6*  PHOS 3.7  --   --  8.0*    CBC  Recent Labs Lab 12/02/2015 0729  11/28/2015 1351 12/13/15 0350 12/15/2015 0350  WBC 12.7*  --   --  4.4 10.7*  HGB 18.3*  < > 15.6* 13.5 12.2  HCT 56.9*  < >  46.0 41.3 37.4  PLT 188  --   --  129* 74*  < > = values in this interval not displayed.  Coag's  Recent Labs Lab 11/23/2015 0849  INR 1.26    Sepsis Markers  Recent Labs Lab 12/13/2015 0732 11/22/2015 1005 11/19/2015 1540 12/13/15 0520 01/09/2016 0350  LATICACIDVEN 13.00* 10.83* 3.2*  --   --   PROCALCITON  --   --  33.95 115.02 151.59    ABG  Recent Labs Lab 12/09/2015 2243 12/13/15 0354 12/27/2015 0512  PHART 7.311* 7.289* 7.225*  PCO2ART 37.9 32.3* 38.6  PO2ART 117.0* 97.0 92.0    Liver Enzymes  Recent Labs Lab 11/17/2015 0935 12/11/2015 1023  AST 34 51*  ALT 15 <5*  ALKPHOS 91 83  BILITOT 0.3 0.9  ALBUMIN 3.5 3.1*    Cardiac Enzymes  Recent Labs Lab 12/08/2015 1714 11/14/2015 2055 12/13/15 0520  TROPONINI 0.78* 0.68* 1.05*    Glucose  Recent Labs Lab 01/07/2016 0004 12/23/2015 0006 12/16/2015 0118 12/31/2015 0350 12/20/2015 0736 01/05/2016 0739  GLUCAP 44* 58* 79 129* 49* 100*    Imaging Dg Chest Port 1 View  12/24/2015  CLINICAL DATA:  Respiratory failure with hypoxemia EXAM: PORTABLE CHEST  1 VIEW COMPARISON:  12/03/2015 FINDINGS: Cardiac shadow is stable. An endotracheal tube and nasogastric catheter is well as a right jugular central line are again noted and stable. Bibasilar infiltrative changes are seen left slightly greater than right. No other focal abnormality is noted. IMPRESSION: Bibasilar infiltrate Electronically Signed   By: Inez Catalina M.D.   On: 01/02/2016 07:50     STUDIES:  CT Chest w/o 4/29 >> hazy & patchy airspace opacities in RLL, ? Aspiration with dilated esophagus, volume loss LLL CT ABD/Pelvis w/o 4/29 >> partial small bowel obstruction, no obvious obstructing mass, enlarging R lobe liver lesion 16 mm ECHO 5/1 >>   CULTURES: BCx2 4/29 >>  UA 4/29 >> not done  ANTIBIOTICS: Cefepime 4/29 >>  Vanc 4/29  >> Flagyl 4/29 >> Diflucan 4/29 >>  SIGNIFICANT EVENTS: 4/29  Admit with abdominal pain, lactic acid 13, concern for SBO  4/29   Ex-lap: small bowel resection and wound VAC placed 5/01  Remains on vent, AGMA, AKI, on neo  LINES/TUBES: ETT 4/29 >>  R IJ 4/29 >>  DISCUSSION: [name], smoker, admitted 4/30 with abdominal pain.  Found to have lactic acid of 13, CT concerning for SBO and AKI.  CCS evaluated and taken directly for exploratory laparotomy which showed ischemic bowel, left open post operatively.    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, ongoing tobacco abuse Tobacco Abuse  P:   Full vent support, PRVC 8 cc/kg  Wean PEEP / FiO2 for sats > 88-95% Monitor for auto peep  Daily SBT/WUA/ABG/CXR VAP prevention measures  CARDIOVASCULAR A:  Elevated Troponin - demand ischemia, EKG with non-specific ST wave changes Hx HTN  Septic shock R hand dusky - radial a-line d/c'd P:  Continue neosynephrine Hold home lasix  F/U echo >> CVP monitoring Monitor aline site Trend CVP (7-8)  RENAL A:   Acute Kidney Injury - in setting of SBO, hypovolemia with decreased intake Hypokalemia Anion Gap Acidosis / Lactic Acidosis  Hypocalcemia  P:   Trend BMP / UOP  BMET now Repeat ABG Replace electrolytes as indicated  D51/2 NS @ 100 ml/hr Nephrology following, appreciate input.      GASTROINTESTINAL A:   Abdominal Pain - s/p exploratory laparotomy with small bowel resection in setting of bowel infarct / SBO, VAC placement Suspected SBO  P:   NPO   PPI for SUP  Post-op care per CCS VAC care per WOC/CCS Planned return to OR 5/1 for washout & attempt to close abd  HEMATOLOGIC A:   Leukocytosis  Thrombocytopenia - likely from shock P:  SCD's for DVT prophylaxis  Monitor platelets / evidence of bleeding Hold heparin prophylaxis until cleared with CCS  INFECTIOUS A:   Peritonitis  RLL Opacity  P:   Cultures / abx as above Antifungal as above Trend lactic acid, PCT   ENDOCRINE A:   DM    P:   Q4 CBG with SSI   NEUROLOGIC A:   Pain   P:   RASS goal: -1 to  Ffentanyl gtt per PAD protocol   FAMILY  - Updates: No family at bedside.    - Inter-disciplinary family meet or Palliative Care meeting due by:   5/6   Noe Gens, NP-C [place] Pgr: [phone] or if no answer [phone] 01/13/2016, 9:23 AM

## 2015-12-14 NOTE — Progress Notes (Signed)
2 Days Post-Op  Subjective: Patient is intubated, sedated Still on Neosynephrine, but off Levophed  Objective: Vital signs in last 24 hours: Temp:  [97.3 F (36.3 C)-99 F (37.2 C)] 98.5 F (36.9 C) (05/01 0738) Pulse Rate:  [83-101] 98 (05/01 0730) Resp:  [11-33] 26 (05/01 0730) BP: (71-121)/(42-68) 100/54 mmHg (05/01 0730) SpO2:  [99 %-100 %] 100 % (05/01 0730) Arterial Line BP: (90-126)/(51-61) 116/57 mmHg (04/30 1300) FiO2 (%):  [50 %-60 %] 50 % (05/01 0700) Weight:  [60.2 kg (132 lb 11.5 oz)] 60.2 kg (132 lb 11.5 oz) (05/01 0351) Last BM Date: 01/09/2016  Intake/Output from previous day: 04/30 0701 - 05/01 0700 In: 6572.8 [I.V.:4512.8; NG/GT:180; IV Piggyback:1880] Out: 2080 [Urine:80; Emesis/NG output:1200; Drains:800] Intake/Output this shift:    General appearance: intubated, sedated; minimal response to stimuli GI: soft, non-distended; no bowel sounds VAC with good seal  Lab Results:   Recent Labs  12/13/15 0350 01/12/2016 0350  WBC 4.4 10.7*  HGB 13.5 12.2  HCT 41.3 37.4  PLT 129* 74*   BMET  Recent Labs  12/13/15 1513 01/08/2016 0350  NA 140 141  K 5.0 4.9  CL 108 108  CO2 17* 17*  GLUCOSE 109* 100*  BUN 42* 53*  CREATININE 3.32* 4.07*  CALCIUM 5.4* 5.7*   PT/INR  Recent Labs  11/27/2015 0849  LABPROT 16.0*  INR 1.26   ABG  Recent Labs  12/13/15 0354 12/27/2015 0512  PHART 7.289* 7.225*  HCO3 15.4* 15.9*    Studies/Results: Ct Abdomen Pelvis Wo Contrast  11/15/2015  CLINICAL DATA:  Abdominal pain. Chest pain. Hematemesis. Symptoms for 1 day. Hypotension. Distended abdomen. EXAM: CT CHEST, ABDOMEN AND PELVIS WITHOUT CONTRAST TECHNIQUE: Multidetector CT imaging of the chest, abdomen and pelvis was performed following the standard protocol without IV contrast. COMPARISON:  03/24/2009.  06/11/2007. FINDINGS: CT CHEST Esophagus is distended with fluid. There is no esophageal mass. The stomach is also distended. There are atherosclerotic changes  involving the aortic arch, great vessels, and coronary arteries. No pericardial effusion.  No abnormal mediastinal adenopathy. Normal thyroid gland ORIF left clavicle.  No breakage or loosening of the hardware. Emphysema. Minimal dependent atelectasis in the left lower lobe. Hazy airspace opacities at the base of the right middle and dependent right lower lobe. There are some ground-glass opacities in the medial left upper lobe. See image 74 anteriorly. There is deep tendon hazy opacity in the left upper lobe. These findings likely represent volume loss. No pleural effusion.  No pneumothorax. Mild T7 superior endplate wedge compression deformity, minimal superior endplate depression at QA348G and T11 are noted. There is no prior imaging to correlate these fractures. There are age-indeterminate. There are no definite acute fracture lines in these have a chronic appearance. T12 compression fracture is stable compared with prior imaging. CT ABDOMEN AND PELVIS Stomach, proximal small bowel, and mid small bowel are all distended. Distal small bowel at in the region of the mid ileum is also distended with feculent material. There is a transition point to nondistended small bowel in the lower abdomen anteriorly just to the right of midline on image 89. There is no obvious mass or hernia. Distal small bowel and terminal ileum are decompressed. There is some stool and gas in the colon. No free intraperitoneal gas.  No free intraperitoneal fluid. Small ventral hernia containing adipose tissue on image 69 is stable. There are hypodensities scattered throughout the liver. There is a right lobe liver lesion on image 53 that has enlarged  and now measures 16 mm. Previously, it was less than 10 mm. Postcholecystectomy Calcified granulomata in the spleen. Pancreas and left adrenal gland are unremarkable. Stable right adrenal low-density adenoma. Atherosclerotic changes of the aorta without evidence of aneurysm. Bladder is decompressed.  Uterus is absent. Adnexa are unremarkable. Severe L3 compression deformity is stable. No other lumbar compression fracture is identified. Mild facet arthropathy in the lower lumbar spine. IMPRESSION: Hazy and patchy airspace opacities in the lower right lung as described. Considering the esophagus is dilated. Aspiration pneumonia is not excluded. Hazy ground-glass in the left lung is felt to be related to volume loss. Partial small bowel obstruction pattern. The transition point is in the right lower abdomen anteriorly in the mid ileum. No obvious obstructing mass. Enlarging right lobe liver lesion which measures 16 mm. Contrast-enhanced MRI is recommended. Multiple compression fractures are likely chronic. Fractures in the lower thoracic spine and lumbar spine are stable. There is no prior imaging for comparison of the mid thoracic spine compression fractures, but they have a chronic appearance. MRI can be performed to further delineate and better age fractures if desired. Postoperative changes. Electronically Signed   By: Marybelle Killings M.D.   On: 11/14/2015 08:49   Ct Chest Wo Contrast  12/10/2015  CLINICAL DATA:  Abdominal pain. Chest pain. Hematemesis. Symptoms for 1 day. Hypotension. Distended abdomen. EXAM: CT CHEST, ABDOMEN AND PELVIS WITHOUT CONTRAST TECHNIQUE: Multidetector CT imaging of the chest, abdomen and pelvis was performed following the standard protocol without IV contrast. COMPARISON:  03/24/2009.  06/11/2007. FINDINGS: CT CHEST Esophagus is distended with fluid. There is no esophageal mass. The stomach is also distended. There are atherosclerotic changes involving the aortic arch, great vessels, and coronary arteries. No pericardial effusion.  No abnormal mediastinal adenopathy. Normal thyroid gland ORIF left clavicle.  No breakage or loosening of the hardware. Emphysema. Minimal dependent atelectasis in the left lower lobe. Hazy airspace opacities at the base of the right middle and dependent  right lower lobe. There are some ground-glass opacities in the medial left upper lobe. See image 74 anteriorly. There is deep tendon hazy opacity in the left upper lobe. These findings likely represent volume loss. No pleural effusion.  No pneumothorax. Mild T7 superior endplate wedge compression deformity, minimal superior endplate depression at QA348G and T11 are noted. There is no prior imaging to correlate these fractures. There are age-indeterminate. There are no definite acute fracture lines in these have a chronic appearance. T12 compression fracture is stable compared with prior imaging. CT ABDOMEN AND PELVIS Stomach, proximal small bowel, and mid small bowel are all distended. Distal small bowel at in the region of the mid ileum is also distended with feculent material. There is a transition point to nondistended small bowel in the lower abdomen anteriorly just to the right of midline on image 89. There is no obvious mass or hernia. Distal small bowel and terminal ileum are decompressed. There is some stool and gas in the colon. No free intraperitoneal gas.  No free intraperitoneal fluid. Small ventral hernia containing adipose tissue on image 69 is stable. There are hypodensities scattered throughout the liver. There is a right lobe liver lesion on image 53 that has enlarged and now measures 16 mm. Previously, it was less than 10 mm. Postcholecystectomy Calcified granulomata in the spleen. Pancreas and left adrenal gland are unremarkable. Stable right adrenal low-density adenoma. Atherosclerotic changes of the aorta without evidence of aneurysm. Bladder is decompressed. Uterus is absent. Adnexa are unremarkable. Severe  L3 compression deformity is stable. No other lumbar compression fracture is identified. Mild facet arthropathy in the lower lumbar spine. IMPRESSION: Hazy and patchy airspace opacities in the lower right lung as described. Considering the esophagus is dilated. Aspiration pneumonia is not  excluded. Hazy ground-glass in the left lung is felt to be related to volume loss. Partial small bowel obstruction pattern. The transition point is in the right lower abdomen anteriorly in the mid ileum. No obvious obstructing mass. Enlarging right lobe liver lesion which measures 16 mm. Contrast-enhanced MRI is recommended. Multiple compression fractures are likely chronic. Fractures in the lower thoracic spine and lumbar spine are stable. There is no prior imaging for comparison of the mid thoracic spine compression fractures, but they have a chronic appearance. MRI can be performed to further delineate and better age fractures if desired. Postoperative changes. Electronically Signed   By: Marybelle Killings M.D.   On: 12/02/2015 08:49   Dg Chest Port 1 View  01/09/2016  CLINICAL DATA:  Respiratory failure with hypoxemia EXAM: PORTABLE CHEST 1 VIEW COMPARISON:  11/19/2015 FINDINGS: Cardiac shadow is stable. An endotracheal tube and nasogastric catheter is well as a right jugular central line are again noted and stable. Bibasilar infiltrative changes are seen left slightly greater than right. No other focal abnormality is noted. IMPRESSION: Bibasilar infiltrate Electronically Signed   By: Inez Catalina M.D.   On: 01/09/2016 07:50   Dg Chest Port 1 View  12/03/2015  CLINICAL DATA:  Endotracheal tube placement status post exploratory laparotomy. EXAM: PORTABLE CHEST 1 VIEW COMPARISON:  Same day. FINDINGS: The heart size and mediastinal contours are within normal limits. Nasogastric tube is seen entering the stomach. Endotracheal tube is seen projected over tracheal air shadow with distal tip 6 cm above the carina. Right internal jugular catheter is noted with distal tip overlying expected position of the SVC. No pneumothorax is noted. Interval development of bilateral basilar opacities are noted concerning for edema or atelectasis with associated pleural effusions. Status post surgical internal fixation of left  clavicle. IMPRESSION: Endotracheal and nasogastric tubes in grossly good position. New bilateral basilar opacities are noted concerning for edema or atelectasis with associated pleural effusions. Electronically Signed   By: Marijo Conception, M.D.   On: 12/11/2015 14:50    Anti-infectives: Anti-infectives    Start     Dose/Rate Route Frequency Ordered Stop   12/19/2015 1600  fluconazole (DIFLUCAN) IVPB 200 mg     200 mg 100 mL/hr over 60 Minutes Intravenous Every 24 hours 12/13/15 1431     12/28/2015 1000  vancomycin (VANCOCIN) IVPB 1000 mg/200 mL premix     1,000 mg 200 mL/hr over 60 Minutes Intravenous Every 24 hours 12/24/2015 0807     12/13/15 1530  fluconazole (DIFLUCAN) IVPB 400 mg     400 mg 100 mL/hr over 120 Minutes Intravenous  Once 12/13/15 1431 12/13/15 1830   12/13/15 1500  metroNIDAZOLE (FLAGYL) IVPB 500 mg     500 mg 100 mL/hr over 60 Minutes Intravenous Every 6 hours 12/13/15 1413     12/13/15 0900  vancomycin (VANCOCIN) IVPB 750 mg/150 ml premix  Status:  Discontinued     750 mg 150 mL/hr over 60 Minutes Intravenous Every 24 hours 11/28/2015 1455 12/13/15 1125   12/11/2015 1145  ceFEPIme (MAXIPIME) 1 g in dextrose 5 % 50 mL IVPB     1 g 100 mL/hr over 30 Minutes Intravenous STAT 11/19/2015 1136 11/20/2015 1150   11/19/2015 1000  ceFEPIme (MAXIPIME)  1 g in dextrose 5 % 50 mL IVPB     1 g 100 mL/hr over 30 Minutes Intravenous Every 24 hours 11/25/2015 0950     11/24/2015 0900  vancomycin (VANCOCIN) IVPB 1000 mg/200 mL premix     1,000 mg 200 mL/hr over 60 Minutes Intravenous  Once 11/18/2015 0857 12/08/2015 1022      Assessment/Plan: s/p Procedure(s): EXPLORATORY LAPAROTOMY, SMALL BOWEL RESECTION WITH WOUND VAC PLACEMENT (N/A)  S/p Emergent exploratory laparotomy for small bowel infarction secondary to closed-loop small bowel obstruction Open abdomen    Return to OR today for washout/ SB anastomosis/ attempt to close abdomen  Attempted to call family for consent, but no answer at  number. No voicemail.   LOS: 2 days    Kalecia Hartney K. 01/10/2016

## 2015-12-14 NOTE — Progress Notes (Signed)
Inpatient Diabetes Program Recommendations  AACE/ADA: New Consensus Statement on Inpatient Glycemic Control (2015)  Target Ranges:  Prepandial:   less than 140 mg/dL      Peak postprandial:   less than 180 mg/dL (1-2 hours)      Critically ill patients:  140 - 180 mg/dL   Review of Glycemic Control  Diabetes history: DM2 Outpatient Diabetes medications: None Current orders for Inpatient glycemic control: Novolog sensitive Q4H  Results for [name], [name] (MRN [id number]) as of 12/24/2015 15:13  Ref. Range 12/28/2015 03:50 12/20/2015 07:36 01/10/2016 07:39 01/03/2016 10:36 12/30/2015 12:49  Glucose-Capillary Latest Ref Range: 65-99 mg/dL 129 (H) 49 (L) 100 (H) 85 105 (H)    Inpatient Diabetes Program Recommendations:    Consider ICU Hyperglycemia Protocol.  Will follow. Thank you. Lorenda Peck, RD, LDN, CDE Inpatient Diabetes Coordinator [phone]
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
## 2015-12-14 NOTE — Progress Notes (Signed)
Assessment/Plan: 70 year old WF with likely lung compromise from tobacco abuse who presents with ischemic bowel s/p resectionx2 complicated by hypotension requiring pressors, lactic acidosis and now AKI with hyperkalemia 1. AKI, oliguirc- unknown baseline renal function- was normal several years ago- presented with creatinine 1.9, now worsening; prob will need CVVHD, give alkali 2. Hypertension/volume - soft on pressors, getting some volume 3. Hyperkalemia- improved 4. Metabolic acidosis-  5. Hypocalcemia-  has received repletion    Subjective: Interval History: S/p lap again today with repeat SB resection  Objective: Vital signs in last 24 hours: Temp:  [97.3 F (36.3 C)-99 F (37.2 C)] 97.7 F (36.5 C) (05/01 1252) Pulse Rate:  [88-101] 88 (05/01 1205) Resp:  [11-33] 30 (05/01 1205) BP: (71-121)/(42-68) 110/48 mmHg (05/01 1205) SpO2:  [99 %-100 %] 100 % (05/01 1205) FiO2 (%):  [50 %] 50 % (05/01 0800) Weight:  [60.2 kg (132 lb 11.5 oz)] 60.2 kg (132 lb 11.5 oz) (05/01 0351) Weight change: 10.2 kg (22 lb 7.8 oz)  Intake/Output from previous day: 04/30 0701 - 05/01 0700 In: 6572.8 [I.V.:4512.8; NG/GT:180; IV Piggyback:1880] Out: 2080 [Urine:80; Emesis/NG output:1200; Drains:800] Intake/Output this shift: Total I/O In: 1310.2 [I.V.:810.2; IV Piggyback:500] Out: 165 [Urine:30; Emesis/NG output:50; Drains:75; Blood:10]  General appearance: sedated post op Abd-post oppost op  Lab Results:  Recent Labs  12/13/15 0350 12/27/2015 0350  WBC 4.4 10.7*  HGB 13.5 12.2  HCT 41.3 37.4  PLT 129* 74*   BMET:  Recent Labs  12/13/15 1513 12/24/2015 0350  NA 140 141  K 5.0 4.9  CL 108 108  CO2 17* 17*  GLUCOSE 109* 100*  BUN 42* 53*  CREATININE 3.32* 4.07*  CALCIUM 5.4* 5.7*   No results for input(s): PTH in the last 72 hours. Iron Studies: No results for input(s): IRON, TIBC, TRANSFERRIN, FERRITIN in the last 72 hours. Studies/Results: Dg Chest Port 1 View  01/09/2016   CLINICAL DATA:  Respiratory failure with hypoxemia EXAM: PORTABLE CHEST 1 VIEW COMPARISON:  12/07/2015 FINDINGS: Cardiac shadow is stable. An endotracheal tube and nasogastric catheter is well as a right jugular central line are again noted and stable. Bibasilar infiltrative changes are seen left slightly greater than right. No other focal abnormality is noted. IMPRESSION: Bibasilar infiltrate Electronically Signed   By: Inez Catalina M.D.   On: 01/10/2016 07:50   Dg Chest Port 1 View  11/23/2015  CLINICAL DATA:  Endotracheal tube placement status post exploratory laparotomy. EXAM: PORTABLE CHEST 1 VIEW COMPARISON:  Same day. FINDINGS: The heart size and mediastinal contours are within normal limits. Nasogastric tube is seen entering the stomach. Endotracheal tube is seen projected over tracheal air shadow with distal tip 6 cm above the carina. Right internal jugular catheter is noted with distal tip overlying expected position of the SVC. No pneumothorax is noted. Interval development of bilateral basilar opacities are noted concerning for edema or atelectasis with associated pleural effusions. Status post surgical internal fixation of left clavicle. IMPRESSION: Endotracheal and nasogastric tubes in grossly good position. New bilateral basilar opacities are noted concerning for edema or atelectasis with associated pleural effusions. Electronically Signed   By: Marijo Conception, M.D.   On: 11/24/2015 14:50    Scheduled: . antiseptic oral rinse  7 mL Mouth Rinse QID  . ceFEPime (MAXIPIME) IV  1 g Intravenous Q24H  . chlorhexidine gluconate (SAGE KIT)  15 mL Mouth Rinse BID  . fentaNYL (SUBLIMAZE) injection  50 mcg Intravenous Once  . fluconazole (DIFLUCAN) IV  200 mg Intravenous Q24H  . insulin aspart  0-9 Units Subcutaneous Q4H  . metronidazole  500 mg Intravenous Q6H  . [START ON 12/15/2015] pantoprazole (PROTONIX) IV  40 mg Intravenous Q12H  . sodium chloride  1,000 mL Intravenous Once  . sodium  chloride  1,000 mL Intravenous Once  . sodium chloride  1,000 mL Intravenous Once  . vancomycin  1,000 mg Intravenous Q24H    LOS: 2 days   Tari Lecount C 12/31/2015,2:14 PM

## 2015-12-14 NOTE — Progress Notes (Signed)
eLink Physician-Brief Progress Note Patient Name: Coua Caudill DOB: 1945-10-22 MRN: [id number]   Date of Service  01/10/2016  HPI/Events of Note  Nurse reports that there is a very weak dorsalis pedis pulse in right lower extremity now.  eICU Interventions  Continue with right lower extremity arterial duplex     Intervention Category Intermediate Interventions: Other:  Tera Partridge 01/11/2016, 1:21 AM

## 2015-12-14 NOTE — Progress Notes (Signed)
eLink Physician-Brief Progress Note Patient Name: Breanna Warren DOB: 09-14-1945 MRN: [id number]   Date of Service  01/11/2016  HPI/Events of Note  Notified by bedside nurse possible changes in level of alertness. Currently on fentanyl infusion for sedation. Can recheck shows patient slightly tachypneic above the set ventilator rate with lightening of sedation. Remains on vasopressor infusion. Nurse reports unable to Doppler a dorsalis pedis pulse and right lower extremity. Also patient has had reported episodes of hypoglycemia requiring treatment.  eICU Interventions  1. StatArterial ultrasound right lower extremity 2. Switch maintenance IV fluid to D5 half-normal saline at 100 mL per hour IV 3. Continue current level of sedation and close monitoring     Intervention Category Major Interventions: Other:  Tera Partridge 01/08/2016, 12:46 AM

## 2015-12-14 NOTE — Anesthesia Preprocedure Evaluation (Signed)
Anesthesia Evaluation  Patient identified by MRN, date of birth, ID band Patient awake    Reviewed: Allergy & Precautions, NPO status , Patient's Chart, lab work & pertinent test results, Unable to perform ROS - Chart review only  Airway Mallampati: Intubated       Dental no notable dental hx.    Pulmonary COPD, Current Smoker,    Pulmonary exam normal breath sounds clear to auscultation       Cardiovascular negative cardio ROS Normal cardiovascular exam Rhythm:Regular Rate:Normal     Neuro/Psych negative neurological ROS  negative psych ROS   GI/Hepatic negative GI ROS, Neg liver ROS,   Endo/Other  diabetes  Renal/GU negative Renal ROS  negative genitourinary   Musculoskeletal negative musculoskeletal ROS (+)   Abdominal   Peds negative pediatric ROS (+)  Hematology negative hematology ROS (+)   Anesthesia Other Findings   Reproductive/Obstetrics negative OB ROS                             Anesthesia Physical Anesthesia Plan  ASA: III  Anesthesia Plan: General   Post-op Pain Management:    Induction: Intravenous  Airway Management Planned: Oral ETT  Additional Equipment:   Intra-op Plan:   Post-operative Plan: Post-operative intubation/ventilation  Informed Consent: I have reviewed the patients History and Physical, chart, labs and discussed the procedure including the risks, benefits and alternatives for the proposed anesthesia with the patient or authorized representative who has indicated his/her understanding and acceptance.     Plan Discussed with: CRNA  Anesthesia Plan Comments:         Anesthesia Quick Evaluation

## 2015-12-14 NOTE — Anesthesia Procedure Notes (Signed)
Date/Time: 01/04/2016 11:00 AM Performed by: Jacquiline Doe A Pre-anesthesia Checklist: Patient identified, Timeout performed, Emergency Drugs available, Suction available and Patient being monitored Patient Re-evaluated:Patient Re-evaluated prior to inductionOxygen Delivery Method: Circle system utilized Preoxygenation: Pre-oxygenation with 100% oxygen Intubation Type: Inhalational induction with existing ETT and Combination inhalational/ intravenous induction Placement Confirmation: breath sounds checked- equal and bilateral and positive ETCO2 Tube secured with: Tape Dental Injury: Teeth and Oropharynx as per pre-operative assessment

## 2015-12-14 NOTE — Progress Notes (Signed)
assissted crna in transport of pt to OR. Pt taken off ventilator and manually ventilated to OR by CRNA. Pt stable with no complications. RT will continue to monitor.

## 2015-12-14 NOTE — Progress Notes (Signed)
ANTIBIOTIC CONSULT NOTE  Pharmacy Consult for vancomycin Indication: sepsis  Allergies  Allergen Reactions  . Penicillins Other (See Comments)    "Puts me in another world"    Patient Measurements: Body Weight: ~ 110 pounds (per patient); ~ 50kg Body Height: ~ 63 inches (per patient)  Vital Signs: Temp: 99 F (37.2 C) (05/01 0400) Temp Source: Oral (05/01 0400) BP: 98/49 mmHg (05/01 0715) Pulse Rate: 99 (05/01 0715) 04/30 0701 - 05/01 0700 In: 6572.8 [I.V.:4512.8; NG/GT:180; IV Piggyback:1880] Out: 2080 [Urine:80; Emesis/NG output:1200; Drains:800] Intake/Output from this shift:    Labs:  Recent Labs  12/08/2015 0729  11/14/2015 1351 12/13/15 0350 12/13/15 0520 12/13/15 1513 12/25/2015 0350  WBC 12.7*  --   --  4.4  --   --  10.7*  HGB 18.3*  < > 15.6* 13.5  --   --  12.2  PLT 188  --   --  129*  --   --  74*  CREATININE  --   < > 1.50*  --  2.66* 3.32* 4.07*  < > = values in this interval not displayed. Microbiology: Recent Results (from the past 720 hour(s))  Blood culture (routine x 2)     Status: None (Preliminary result)   Collection Time: 12/08/2015  7:43 AM  Result Value Ref Range Status   Specimen Description BLOOD RIGHT ANTECUBITAL  Final   Special Requests IN PEDIATRIC BOTTLE 3CC  Final   Culture NO GROWTH 1 DAY  Final   Report Status PENDING  Incomplete  Blood culture (routine x 2)     Status: None (Preliminary result)   Collection Time: 11/27/2015  8:48 AM  Result Value Ref Range Status   Specimen Description BLOOD LEFT FOREARM  Final   Special Requests BOTTLES DRAWN AEROBIC ONLY 5CC  Final   Culture NO GROWTH 1 DAY  Final   Report Status PENDING  Incomplete  MRSA PCR Screening     Status: None   Collection Time: 11/29/2015  1:40 PM  Result Value Ref Range Status   MRSA by PCR NEGATIVE NEGATIVE Final    Comment:        The GeneXpert MRSA Assay (FDA approved for NASAL specimens only), is one component of a comprehensive MRSA colonization surveillance  program. It is not intended to diagnose MRSA infection nor to guide or monitor treatment for MRSA infections.     Assessment: 70yo female  with c/o abdominal pain on cefepime/vancomycin . She is noted with ischemic bowel and s/p emergent laparotomy on 4/29. -WBC= 10.7, afeb,. CrCl ~ 15. UOP minimal. , CrCl ~10  Random vancomycin level is 19 drawn about 19.5 hours since last dose, although not yet at steady state. Still with very little uop (35ml) and scr increased to 4. Renal is holding on dialysis with no clear indication.   4/29 cefepime 4/29 vanc  4/29 blood x2-ngtd 4/29 MRSA PCR- neg  Goal of Therapy:  Vancomycin trough= 15-20   Plan:  -No cefepime changes needed -Will schedule vancomycin 1g q24 hours and adjust dosing as indicated -Will follow renal function, cultures and clinical progress  Erin Hearing PharmD., BCPS Clinical Pharmacist Pager [phone] 01/07/2016 8:01 AM

## 2015-12-14 NOTE — Op Note (Signed)
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 Asst.: Dr. Nedra Hai Anesthesia: Gen. endotracheal Indications: This is a 70 year old female with multiple medical problems who presented on 12/05/2015.  She was taken emergently to the operating room where she underwent exploratory laparotomy with resection of approximately 30 cm of ileum. That segment of bowel was grossly infarcted. However there scattered ischemic changes throughout the remainder the small bowel and the cecum. Therefore her abdomen was left open. She comes back today 48 hours later for reexploration.  Description of procedure: The patient is brought to the operating room and placed in supine position on the operating table. After an adequate level general anesthesia was obtained, her abdominal VAC sponge was removed. The skin around the abdominal wound was prepped with Betadine and we draped sterile fashion. We removed the previous dressing and explored the abdomen. The short segment of terminal ileum has some patchy areas of ecchymosis but no gross necrosis. The staple line is intact. The cecum also shows some patchy ecchymosis but no gross areas of necrosis. We identified the proximal segment of bowel. There is some scattered areas of dusky bowel and ecchymosis. However in the distal jejunum there is obvious necrosis. This bowel is very thin-walled and appears green in color. A pulseless palpated in the SMA. The most proximal part of the jejunum is pink and viable. We used a GIA-75 stapler to resect the 50 cm of grossly necrotic bowel. There is no sign of perforation or leak. We used the LigaSure device to divide the mesentery. The 2 ends of the more proximal resection are each tagged with 3-0 silk sutures. We inspected for  hemostasis. Liver appears healthy. The stomach appears normal. We replaced the open abdominal wound VAC and placed this back to suction. The patient was then transported back to the intensive care unit in critical condition.  We will come back in 48 hours for another exploration.  Prognosis is guarded.  Imogene Burn. Georgette Dover, MD, [facility]  General/ Trauma Surgery  01/06/2016 11:34 AM

## 2015-12-14 DEATH — deceased

## 2015-12-15 ENCOUNTER — Inpatient Hospital Stay (HOSPITAL_COMMUNITY): Payer: Medicare Other

## 2015-12-15 ENCOUNTER — Encounter (HOSPITAL_COMMUNITY): Payer: Self-pay | Admitting: Surgery

## 2015-12-15 DIAGNOSIS — N179 Acute kidney failure, unspecified: Secondary | ICD-10-CM | POA: Insufficient documentation

## 2015-12-15 DIAGNOSIS — K56609 Unspecified intestinal obstruction, unspecified as to partial versus complete obstruction: Secondary | ICD-10-CM | POA: Insufficient documentation

## 2015-12-15 DIAGNOSIS — J9601 Acute respiratory failure with hypoxia: Secondary | ICD-10-CM | POA: Insufficient documentation

## 2015-12-15 LAB — COMPREHENSIVE METABOLIC PANEL
ALBUMIN: 1.2 g/dL — AB (ref 3.5–5.0)
ALT: 3067 U/L — ABNORMAL HIGH (ref 14–54)
ANION GAP: 15 (ref 5–15)
AST: 2541 U/L — ABNORMAL HIGH (ref 15–41)
Alkaline Phosphatase: 116 U/L (ref 38–126)
BUN: 65 mg/dL — ABNORMAL HIGH (ref 6–20)
CO2: 18 mmol/L — AB (ref 22–32)
Calcium: 5.7 mg/dL — CL (ref 8.9–10.3)
Chloride: 103 mmol/L (ref 101–111)
Creatinine, Ser: 4.69 mg/dL — ABNORMAL HIGH (ref 0.44–1.00)
GFR calc non Af Amer: 9 mL/min — ABNORMAL LOW (ref >60)
GFR, EST AFRICAN AMERICAN: 10 mL/min — AB (ref >60)
GLUCOSE: 57 mg/dL — AB (ref 65–99)
POTASSIUM: 4.2 mmol/L (ref 3.5–5.1)
SODIUM: 136 mmol/L (ref 135–145)
Total Bilirubin: 0.5 mg/dL (ref 0.3–1.2)
Total Protein: <3 g/dL — ABNORMAL LOW (ref 6.5–8.1)

## 2015-12-15 LAB — TYPE AND SCREEN
ABO/RH(D): O POS
ANTIBODY SCREEN: NEGATIVE
UNIT DIVISION: 0
UNIT DIVISION: 0

## 2015-12-15 LAB — RENAL FUNCTION PANEL
Anion gap: 10 (ref 5–15)
BUN: 64 mg/dL — AB (ref 6–20)
CALCIUM: 5.5 mg/dL — AB (ref 8.9–10.3)
CO2: 17 mmol/L — ABNORMAL LOW (ref 22–32)
CREATININE: 4.45 mg/dL — AB (ref 0.44–1.00)
Chloride: 106 mmol/L (ref 101–111)
GFR calc Af Amer: 11 mL/min — ABNORMAL LOW (ref >60)
GFR, EST NON AFRICAN AMERICAN: 9 mL/min — AB (ref >60)
Glucose, Bld: 158 mg/dL — ABNORMAL HIGH (ref 65–99)
PHOSPHORUS: 6.5 mg/dL — AB (ref 2.5–4.6)
Potassium: 3.7 mmol/L (ref 3.5–5.1)
Sodium: 133 mmol/L — ABNORMAL LOW (ref 135–145)

## 2015-12-15 LAB — GLUCOSE, CAPILLARY
GLUCOSE-CAPILLARY: 117 mg/dL — AB (ref 65–99)
GLUCOSE-CAPILLARY: 133 mg/dL — AB (ref 65–99)
GLUCOSE-CAPILLARY: 69 mg/dL (ref 65–99)
GLUCOSE-CAPILLARY: 82 mg/dL (ref 65–99)
Glucose-Capillary: 75 mg/dL (ref 65–99)
Glucose-Capillary: 66 mg/dL (ref 65–99)
Glucose-Capillary: 85 mg/dL (ref 65–99)
Glucose-Capillary: 63 mg/dL — ABNORMAL LOW (ref 65–99)

## 2015-12-15 LAB — CALCIUM, IONIZED: Calcium, Ionized, Serum: 3 mg/dL — ABNORMAL LOW (ref 4.5–5.6)

## 2015-12-15 MED ORDER — DEXTROSE 50 % IV SOLN
INTRAVENOUS | Status: AC
  Filled 2015-12-15: qty 50

## 2015-12-15 MED ORDER — PRISMASOL BGK 4/2.5 32-4-2.5 MEQ/L IV SOLN
INTRAVENOUS | Status: DC
  Administered 2015-12-15: 17:00:00 via INTRAVENOUS_CENTRAL
  Filled 2015-12-15 (×3): qty 5000

## 2015-12-15 MED ORDER — SODIUM CHLORIDE 0.9 % IV SOLN
2.0000 g | Freq: Once | INTRAVENOUS | Status: AC
  Administered 2015-12-15: 2 g via INTRAVENOUS
  Filled 2015-12-15: qty 20

## 2015-12-15 MED ORDER — PRISMASOL BGK 4/2.5 32-4-2.5 MEQ/L IV SOLN
INTRAVENOUS | Status: DC
  Administered 2015-12-15 – 2015-12-16 (×4): via INTRAVENOUS_CENTRAL
  Filled 2015-12-15 (×9): qty 5000

## 2015-12-15 MED ORDER — DEXTROSE 50 % IV SOLN
25.0000 mL | Freq: Once | INTRAVENOUS | Status: AC
  Administered 2015-12-15: 25 mL via INTRAVENOUS

## 2015-12-15 MED ORDER — SODIUM CHLORIDE 0.9 % IV SOLN
1.0000 g | Freq: Once | INTRAVENOUS | Status: AC
  Administered 2015-12-15: 1 g via INTRAVENOUS
  Filled 2015-12-15: qty 10

## 2015-12-15 MED ORDER — HEPARIN (PORCINE) 2000 UNITS/L FOR CRRT
INTRAVENOUS_CENTRAL | Status: DC | PRN
  Filled 2015-12-15 (×3): qty 1000

## 2015-12-15 MED ORDER — DEXTROSE 5 % IV SOLN
1.0000 g | Freq: Two times a day (BID) | INTRAVENOUS | Status: DC
  Administered 2015-12-15: 1 g via INTRAVENOUS
  Filled 2015-12-15 (×3): qty 1

## 2015-12-15 MED ORDER — DEXTROSE 50 % IV SOLN
INTRAVENOUS | Status: AC
  Administered 2015-12-15: 25 mL via INTRAVENOUS
  Filled 2015-12-15: qty 50

## 2015-12-15 MED ORDER — HEPARIN SODIUM (PORCINE) 1000 UNIT/ML DIALYSIS
1000.0000 [IU] | INTRAMUSCULAR | Status: DC | PRN
  Administered 2015-12-16: 4000 [IU] via INTRAVENOUS_CENTRAL
  Filled 2015-12-15: qty 6
  Filled 2015-12-15 (×2): qty 2
  Filled 2015-12-15: qty 6

## 2015-12-15 NOTE — Progress Notes (Signed)
CRITICAL VALUE ALERT  Critical value received:  Calcium 5.7  Date of notification:  12/15/15  Time of notification:  0800  Critical value read back:Yes.    Nurse who received alert:  Jake Bathe, RN  MD notified (1st page):    Time of first page:    MD notified (2nd page):  Time of second page:  Responding MD:    Time MD responded:    Noe Gens, NP outside of patients  Room and made aware, new orders placed.  Rowe Pavy, RN

## 2015-12-15 NOTE — Progress Notes (Signed)
Assessment/Plan:  70 year old WF with likely lung compromise from tobacco abuse who presents with ischemic bowel s/p resectionx2 complicated by hypotension requiring pressors, lactic acidosis and now AKI with hyperkalemia 1. AKI, oliguirc-unknown baseline renal function- was normal several years ago- presented with creatinine 1.9, now worsening; UOP 180m over the past 24 hours. On sodium acetate. Will start CVVHD today.  2. Hypertension/volume - soft. on pressors, getting volume. CVPs 8-12 3. Hyperkalemia- improved 4. Metabolic acidosis- 2/2 lactic acidosis. Improving.  5. Hypocalcemia-  5.7 this morning, calcium gluconate ordered.   Subjective: Intubated. Does not respond to commands.   Objective: Vital signs in last 24 hours: Temp:  [97.7 F (36.5 C)-98.9 F (37.2 C)] 98.9 F (37.2 C) (05/02 0722) Pulse Rate:  [83-98] 92 (05/02 0730) Resp:  [17-31] 30 (05/02 0730) BP: (75-125)/(43-73) 97/47 mmHg (05/02 0730) SpO2:  [100 %] 100 % (05/02 0807) FiO2 (%):  [40 %-50 %] 40 % (05/02 0807) Weight:  [140 lb 6.9 oz (63.7 kg)] 140 lb 6.9 oz (63.7 kg) (05/02 0218) Weight change: 7 lb 11.5 oz (3.5 kg)  Intake/Output from previous day: 05/01 0701 - 05/02 0700 In: 4586.8 [I.V.:3636.8; NG/GT:150; IV Piggyback:800] Out: 879 [Urine:119; Emesis/NG output:75; Drains:675; Blood:10] Intake/Output this shift: Total I/O In: 122.5 [I.V.:122.5] Out: 10 [Urine:10]  General: sedated on vent Neck: no JVD Heart: RRR Lungs: poor air movement  Abdomen: wound vac in place Extremities: no edema Skin: warm and dry Neuro: sedated   Lab Results:  Recent Labs  12/13/15 0350 01/06/2016 0350  WBC 4.4 10.7*  HGB 13.5 12.2  HCT 41.3 37.4  PLT 129* 74*   BMET:   Recent Labs  12/20/2015 0350 12/15/15 0600  NA 141 136  K 4.9 4.2  CL 108 103  CO2 17* 18*  GLUCOSE 100* 57*  BUN 53* 65*  CREATININE 4.07* 4.69*  CALCIUM 5.7* 5.7*   No results for input(s): PTH in the last 72 hours. Iron  Studies: No results for input(s): IRON, TIBC, TRANSFERRIN, FERRITIN in the last 72 hours. Studies/Results: Dg Chest Port 1 View  12/15/2015  CLINICAL DATA:  Post exploratory laparotomy and small bowel resection for bowel infarction, acute respiratory failure, diabetes mellitus, COPD EXAM: PORTABLE CHEST 1 VIEW COMPARISON:  Portable exam 0613 hours compared to 12/23/2015 FINDINGS: Tip of endotracheal tube projects 5.6 cm above carina. Nasogastric tube extends into stomach. RIGHT jugular central venous catheter tip projects over SVC. Numerous EKG leads project over upper chest. Normal heart size, mediastinal contours, and pulmonary vascularity. Persistent bibasilar infiltrates, little changed. Upper lungs grossly clear. No definite pleural effusion or pneumothorax. Prior plating of the distal LEFT clavicle. IMPRESSION: Persistent bibasilar infiltrates. Electronically Signed   By: MLavonia DanaM.D.   On: 12/15/2015 07:34   Dg Chest Port 1 View  01/10/2016  CLINICAL DATA:  Respiratory failure with hypoxemia EXAM: PORTABLE CHEST 1 VIEW COMPARISON:  11/22/2015 FINDINGS: Cardiac shadow is stable. An endotracheal tube and nasogastric catheter is well as a right jugular central line are again noted and stable. Bibasilar infiltrative changes are seen left slightly greater than right. No other focal abnormality is noted. IMPRESSION: Bibasilar infiltrate Electronically Signed   By: MInez CatalinaM.D.   On: 12/26/2015 07:50    Scheduled: . antiseptic oral rinse  7 mL Mouth Rinse QID  . calcium gluconate  1 g Intravenous Once  . ceFEPime (MAXIPIME) IV  1 g Intravenous Q24H  . chlorhexidine gluconate (SAGE KIT)  15 mL Mouth Rinse BID  .  fentaNYL (SUBLIMAZE) injection  50 mcg Intravenous Once  . fluconazole (DIFLUCAN) IV  200 mg Intravenous Q24H  . insulin aspart  0-9 Units Subcutaneous Q4H  . metronidazole  500 mg Intravenous Q6H  . pantoprazole (PROTONIX) IV  40 mg Intravenous Q12H  . sodium chloride  1,000 mL  Intravenous Once  . sodium chloride  1,000 mL Intravenous Once  . sodium chloride  1,000 mL Intravenous Once  . vancomycin  1,000 mg Intravenous Q24H    LOS: 3 days   Caleb Parker 12/15/2015,8:25 AM  Renal Attending: No evidence of improvement in renal fct. Mild acidosis. Remains VDRF.  Wil start CVVHD to support aggressive treatment. POWELL,ALVIN C     

## 2015-12-15 NOTE — Progress Notes (Signed)
PULMONARY / CRITICAL CARE MEDICINE   Name: Breanna Warren MRN: [id number] DOB: 28-Feb-1946    ADMISSION DATE:  12/02/2015 CONSULTATION DATE:  12/09/2015  REFERRING MD:  Dr. Wyvonnia Dusky   CHIEF COMPLAINT:  Abdominal Pain    SUBJECTIVE:  RN reports neo weaned to 40 mcg, fentanyl at 50 mcg.  Returned to OR for re-exploration 5/1 with necrotic bowel resection in the distal jejunum.  119 ml UOP in last 24 hours   VITAL SIGNS: BP 97/47 mmHg  Pulse 92  Temp(Src) 98.9 F (37.2 C) (Oral)  Resp 30  Ht 5\' 1"  (1.549 m)  Wt 140 lb 6.9 oz (63.7 kg)  BMI 26.55 kg/m2  SpO2 100%  HEMODYNAMICS: CVP:  [7 mmHg-11 mmHg] 10 mmHg  VENTILATOR SETTINGS: Vent Mode:  [-] PRVC FiO2 (%):  [50 %] 50 % Set Rate:  [30 bmp] 30 bmp Vt Set:  [400 mL] 400 mL PEEP:  [8 cmH20] 8 cmH20 Plateau Pressure:  [19 cmH20-20 cmH20] 20 cmH20  INTAKE / OUTPUT: I/O last 3 completed shifts: In: 6635.9 [I.V.:5285.9; NG/GT:240; IV Piggyback:1110] Out: 1899 [Urine:139; Emesis/NG output:675; Drains:1075; Blood:10]  PHYSICAL EXAMINATION: General:  Small adult female on vent, no acute distress HENT : NCAT, ETT in place PULM: CTA B, vent supported breaths CV: RRR  No m/r/g, R thumb/index finger dusky/cool, low cap refill GI: BS infrequent, wound vac in place Ext: warm, trace edema Neuro: sedated Skin:  Dusky toes bilaterally with cap refill <4sec  LABS:  BMET  Recent Labs Lab 12/13/15 0520 12/13/15 1513 12/29/2015 0350  NA 145 140 141  K 2.9* 5.0 4.9  CL 117* 108 108  CO2 18* 17* 17*  BUN 34* 42* 53*  CREATININE 2.66* 3.32* 4.07*  GLUCOSE 127* 109* 100*    Electrolytes  Recent Labs Lab 12/13/15 0520 12/13/15 1513 01/05/2016 0350 12/16/2015 0514  CALCIUM 5.3* 5.4* 5.7*  --   MG 1.2*  --   --  2.6*  PHOS 3.7  --   --  8.0*    CBC  Recent Labs Lab 11/26/2015 0729  11/29/2015 1351 12/13/15 0350 12/23/2015 0350  WBC 12.7*  --   --  4.4 10.7*  HGB 18.3*  < > 15.6* 13.5 12.2  HCT 56.9*  < > 46.0 41.3 37.4   PLT 188  --   --  129* 74*  < > = values in this interval not displayed.  Coag's  Recent Labs Lab 11/23/2015 0849  INR 1.26    Sepsis Markers  Recent Labs Lab 12/03/2015 0732 11/19/2015 1005 12/10/2015 1540 12/13/15 0520 12/18/2015 0350  LATICACIDVEN 13.00* 10.83* 3.2*  --   --   PROCALCITON  --   --  33.95 115.02 151.59    ABG  Recent Labs Lab 11/15/2015 2243 12/13/15 0354 01/09/2016 0512  PHART 7.311* 7.289* 7.225*  PCO2ART 37.9 32.3* 38.6  PO2ART 117.0* 97.0 92.0    Liver Enzymes  Recent Labs Lab 12/07/2015 0935 12/09/2015 1023  AST 34 51*  ALT 15 <5*  ALKPHOS 91 83  BILITOT 0.3 0.9  ALBUMIN 3.5 3.1*    Cardiac Enzymes  Recent Labs Lab 11/23/2015 1714 11/18/2015 2055 12/13/15 0520  TROPONINI 0.78* 0.68* 1.05*    Glucose  Recent Labs Lab 01/12/2016 1249 12/22/2015 1659 01/12/2016 1727 12/16/2015 2022 12/23/2015 2347 12/15/15 0746  GLUCAP 105* 67 111* 72 75 117*    Imaging Dg Chest Port 1 View  12/15/2015  CLINICAL DATA:  Post exploratory laparotomy and small bowel resection for bowel infarction, acute  respiratory failure, diabetes mellitus, COPD EXAM: PORTABLE CHEST 1 VIEW COMPARISON:  Portable exam 0613 hours compared to 01/06/2016 FINDINGS: Tip of endotracheal tube projects 5.6 cm above carina. Nasogastric tube extends into stomach. RIGHT jugular central venous catheter tip projects over SVC. Numerous EKG leads project over upper chest. Normal heart size, mediastinal contours, and pulmonary vascularity. Persistent bibasilar infiltrates, little changed. Upper lungs grossly clear. No definite pleural effusion or pneumothorax. Prior plating of the distal LEFT clavicle. IMPRESSION: Persistent bibasilar infiltrates. Electronically Signed   By: Breanna Warren M.D.   On: 12/15/2015 07:34     STUDIES:  CT Chest w/o 4/29 >> hazy & patchy airspace opacities in RLL, ? Aspiration with dilated esophagus, volume loss LLL CT ABD/Pelvis w/o 4/29 >> partial small bowel obstruction,  no obvious obstructing mass, enlarging R lobe liver lesion 16 mm ECHO 5/1 >> LV nml size, EF 65-70%, no RWMA  CULTURES: BCx2 4/29 >>  UA 4/29 >> not done  ANTIBIOTICS: Cefepime 4/29 >>  Vanc 4/29  >> Flagyl 4/29 >> Diflucan 4/29 >>  SIGNIFICANT EVENTS: 4/29  Admit with abdominal pain, lactic acid 13, concern for SBO  4/29  Ex-lap: small bowel resection and wound VAC placed 5/01  Remains on vent, AGMA, AKI, on neo.  Returned to OR for re-exploration, necrotic bowel resected  LINES/TUBES: ETT 4/29 >>  R IJ 4/29 >>  DISCUSSION: [name], smoker, admitted 4/30 with abdominal pain.  Found to have lactic acid of 13, CT concerning for SBO and AKI.  CCS evaluated and taken directly for exploratory laparotomy which showed ischemic bowel, left open post operatively.    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, ongoing tobacco abuse Tobacco Abuse  P:   Full vent support, PRVC 8 cc/kg  Wean PEEP / FiO2 for sats > 88-95% Monitor for auto peep  Daily SBT/WUA/ABG/CXR VAP prevention measures ETT adjusted overnight - inserted 2 cm  CARDIOVASCULAR A:  Elevated Troponin - demand ischemia, EKG with non-specific ST wave changes Hx HTN  Septic shock R hand dusky - radial a-line d/c'd P:  Wean neosynephrine to off for MAP >65  Hold home lasix  ECHO as above CVP monitoring Monitor aline site  RENAL A:   Acute Kidney Injury - in setting of SBO, hypovolemia with decreased intake Hypokalemia Anion Gap Acidosis / Lactic Acidosis  Hypocalcemia  P:   Trend BMP / UOP  Replace electrolytes as indicated - Ca x1 5/2 (corrects to 7.9) D51/2 NS @ 100 ml/hr Nephrology following, appreciate input     GASTROINTESTINAL A:   Abdominal Pain - s/p exploratory laparotomy with small bowel resection in setting of bowel infarct / SBO, VAC placement Suspected SBO  P:   NPO   PPI for SUP  Post-op care per CCS VAC care per  WOC/CCS  HEMATOLOGIC A:   Leukocytosis  Thrombocytopenia - likely from shock P:  SCD's for DVT prophylaxis  Monitor platelets / evidence of bleeding Hold heparin prophylaxis until cleared with CCS  INFECTIOUS A:   Peritonitis  RLL Opacity  P:   Cultures / abx as above Antifungal as above Trend lactic acid, PCT   ENDOCRINE A:   DM    P:   Q4 CBG with SSI   NEUROLOGIC A:   Pain  P:   RASS goal: -1 to  Ffentanyl gtt per PAD protocol   FAMILY  - Updates: No family at bedside.    -  Inter-disciplinary family meet or Palliative Care meeting due by:   5/6   Noe Gens, NP-C  Pulmonary & Critical Care Pgr: [phone] or if no answer [phone] 12/15/2015, 7:57 AM

## 2015-12-15 NOTE — Procedures (Signed)
Hemodialysis Catheter Insertion Procedure Note Kanisha Kraning [id number] 1946/05/23  Procedure: Insertion of Hemodialysis Catheter Indications: Hemodialysis  Procedure Details Consent: Risks of procedure as well as the alternatives and risks of each were explained to the (patient/caregiver).  Consent for procedure obtained.  Time Out: Verified patient identification, verified procedure, site/side was marked, verified correct patient position, special equipment/implants available, medications/allergies/relevent history reviewed, required imaging and test results available.  Performed  Maximum sterile technique was used including antiseptics, cap, gloves, gown, hand hygiene, mask and sheet.  Skin prep: Chlorhexidine; local anesthetic administered  A Trialysis HD catheter was placed in the left internal jugular vein using the Seldinger technique to 18 cm.  Sutured x3, biopatch applied.   Evaluation Blood flow good Complications: No apparent complications Patient did tolerate procedure well. Chest X-ray ordered to verify placement.  CXR: pending.   Procedure performed under direct supervision of Dr. Lake Bells and with ultrasound guidance for real time vessel cannulation.     Noe Gens, NP-C [facility] Pgr: [phone] or [phone] 12/15/2015, 12:34 PM

## 2015-12-15 NOTE — Progress Notes (Addendum)
eLink Physician-Brief Progress Note Patient Name: Breanna Warren DOB: 08-30-1945 MRN: [id number]   Date of Service  12/15/2015  HPI/Events of Note  Ca++ = 5.5 and Albumin = 1.2. Ca++ corrects to 7.74.  eICU Interventions  Will replete Ca++.     Intervention Category Intermediate Interventions: Electrolyte abnormality - evaluation and management  Caree Wolpert Eugene 12/15/2015, 4:27 PM

## 2015-12-15 NOTE — Progress Notes (Signed)
CRITICAL VALUE ALERT  Critical value received:  Calcium 5.5  Date of notification:  12/15/15  Time of notification:  [id number]  Critical value read back: Yes  Nurse who received alert:  Maia Petties, RN  MD notified (1st page):  Dr. Oletta Darter  Time of first page:  1624  MD notified (2nd page):  Time of second page:  Responding MD:  Dr. Oletta Darter  Time MD responded:  [phone]

## 2015-12-15 NOTE — Care Management Important Message (Signed)
Important Message  Patient Details  Name: Breanna Warren MRN: [id number] Date of Birth: [birth year]   Medicare Important Message Given:  Yes    Nathen May 12/15/2015, 12:17 PM

## 2015-12-15 NOTE — Progress Notes (Signed)
1 Day Post-Op  Subjective: Still on Neo - able to wean slightly No response when off Fentanyl Starting CRRT  Objective: Vital signs in last 24 hours: Temp:  [97.7 F (36.5 C)-98.9 F (37.2 C)] 98.9 F (37.2 C) (05/02 0722) Pulse Rate:  [83-96] 91 (05/02 1100) Resp:  [17-31] 30 (05/02 1100) BP: (75-125)/(43-67) 113/50 mmHg (05/02 1100) SpO2:  [100 %] 100 % (05/02 1100) FiO2 (%):  [40 %-50 %] 40 % (05/02 0807) Weight:  [63.7 kg (140 lb 6.9 oz)] 63.7 kg (140 lb 6.9 oz) (05/02 0218) Last BM Date: 01/10/2016  Intake/Output from previous day: 05/01 0701 - 05/02 0700 In: 4586.8 [I.V.:3636.8; NG/GT:150; IV Piggyback:800] Out: [id number] [Urine:119; Emesis/NG output:75; Drains:675; Blood:10] Intake/Output this shift: Total I/O In: 757.5 [I.V.:357.5; IV Piggyback:400] Out: 280 [Urine:30; Emesis/NG output:100; Drains:150]  General appearance: Intubated, sedated, no response to stimuli GI: soft, VAC intact with good seal Extremities - cool; no palpable pulses - requiring Doppler  Lab Results:   Recent Labs  12/13/15 0350 12/25/2015 0350  WBC 4.4 10.7*  HGB 13.5 12.2  HCT 41.3 37.4  PLT 129* 74*   BMET  Recent Labs  12/21/2015 0350 12/15/15 0600  NA 141 136  K 4.9 4.2  CL 108 103  CO2 17* 18*  GLUCOSE 100* 57*  BUN 53* 65*  CREATININE 4.07* 4.69*  CALCIUM 5.7* 5.7*   PT/INR No results for input(s): LABPROT, INR in the last 72 hours. ABG  Recent Labs  12/13/15 0354 12/25/2015 0512  PHART 7.289* 7.225*  HCO3 15.4* 15.9*    Studies/Results: Dg Chest Port 1 View  12/15/2015  CLINICAL DATA:  Post exploratory laparotomy and small bowel resection for bowel infarction, acute respiratory failure, diabetes mellitus, COPD EXAM: PORTABLE CHEST 1 VIEW COMPARISON:  Portable exam 0613 hours compared to 12/18/2015 FINDINGS: Tip of endotracheal tube projects 5.6 cm above carina. Nasogastric tube extends into stomach. RIGHT jugular central venous catheter tip projects over SVC. Numerous EKG  leads project over upper chest. Normal heart size, mediastinal contours, and pulmonary vascularity. Persistent bibasilar infiltrates, little changed. Upper lungs grossly clear. No definite pleural effusion or pneumothorax. Prior plating of the distal LEFT clavicle. IMPRESSION: Persistent bibasilar infiltrates. Electronically Signed   By: Lavonia Dana M.D.   On: 12/15/2015 07:34   Dg Chest Port 1 View  12/26/2015  CLINICAL DATA:  Respiratory failure with hypoxemia EXAM: PORTABLE CHEST 1 VIEW COMPARISON:  12/04/2015 FINDINGS: Cardiac shadow is stable. An endotracheal tube and nasogastric catheter is well as a right jugular central line are again noted and stable. Bibasilar infiltrative changes are seen left slightly greater than right. No other focal abnormality is noted. IMPRESSION: Bibasilar infiltrate Electronically Signed   By: Inez Catalina M.D.   On: 01/03/2016 07:50    Anti-infectives: Anti-infectives    Start     Dose/Rate Route Frequency Ordered Stop   12/20/2015 1600  fluconazole (DIFLUCAN) IVPB 200 mg     200 mg 100 mL/hr over 60 Minutes Intravenous Every 24 hours 12/13/15 1431     01/10/2016 1000  vancomycin (VANCOCIN) IVPB 1000 mg/200 mL premix     1,000 mg 200 mL/hr over 60 Minutes Intravenous Every 24 hours 12/29/2015 0807     12/13/15 1530  fluconazole (DIFLUCAN) IVPB 400 mg     400 mg 100 mL/hr over 120 Minutes Intravenous  Once 12/13/15 1431 12/13/15 1830   12/13/15 1500  metroNIDAZOLE (FLAGYL) IVPB 500 mg     500 mg 100 mL/hr over 60 Minutes  Intravenous Every 6 hours 12/13/15 1413     12/13/15 0900  vancomycin (VANCOCIN) IVPB 750 mg/150 ml premix  Status:  Discontinued     750 mg 150 mL/hr over 60 Minutes Intravenous Every 24 hours 11/24/2015 1455 12/13/15 1125   11/24/2015 1145  ceFEPIme (MAXIPIME) 1 g in dextrose 5 % 50 mL IVPB     1 g 100 mL/hr over 30 Minutes Intravenous STAT 12/04/2015 1136 11/26/2015 1150   11/26/2015 1000  ceFEPIme (MAXIPIME) 1 g in dextrose 5 % 50 mL IVPB     1  g 100 mL/hr over 30 Minutes Intravenous Every 24 hours 11/14/2015 0950     11/23/2015 0900  vancomycin (VANCOCIN) IVPB 1000 mg/200 mL premix     1,000 mg 200 mL/hr over 60 Minutes Intravenous  Once 12/02/2015 0857 12/05/2015 1022      Assessment/Plan: s/p Procedure(s): EXPLORATORY LAPAROTOMY (N/A) SMALL BOWEL RESECTION (N/A)  S/p Emergent exploratory laparotomy for small bowel infarction secondary to closed-loop small bowel obstruction Open abdomen  Renal failure - CRRT per CCM/ Renal Hepatic failure - check INR Prognosis is bleak.  Return to OR tomorrow for washout/ SB anastomosis/ attempt to close abdomen     LOS: 3 days    Breanna Warren K. 12/15/2015

## 2015-12-16 ENCOUNTER — Encounter (HOSPITAL_COMMUNITY): Payer: Self-pay | Admitting: Certified Registered"

## 2015-12-16 ENCOUNTER — Encounter (HOSPITAL_COMMUNITY): Admission: EM | Disposition: E | Payer: Self-pay | Source: Home / Self Care | Attending: Pulmonary Disease

## 2015-12-16 ENCOUNTER — Inpatient Hospital Stay (HOSPITAL_COMMUNITY): Payer: Medicare Other | Admitting: Certified Registered"

## 2015-12-16 ENCOUNTER — Inpatient Hospital Stay (HOSPITAL_COMMUNITY): Payer: Medicare Other

## 2015-12-16 HISTORY — PX: LAPAROTOMY: SHX154

## 2015-12-16 HISTORY — PX: BOWEL RESECTION: SHX1257

## 2015-12-16 HISTORY — PX: APPLICATION OF WOUND VAC: SHX5189

## 2015-12-16 LAB — RENAL FUNCTION PANEL
ANION GAP: 10 (ref 5–15)
Albumin: 1.2 g/dL — ABNORMAL LOW (ref 3.5–5.0)
BUN: 45 mg/dL — ABNORMAL HIGH (ref 6–20)
CHLORIDE: 104 mmol/L (ref 101–111)
CO2: 21 mmol/L — ABNORMAL LOW (ref 22–32)
CREATININE: 3.33 mg/dL — AB (ref 0.44–1.00)
Calcium: 6.5 mg/dL — ABNORMAL LOW (ref 8.9–10.3)
GFR, EST AFRICAN AMERICAN: 15 mL/min — AB (ref >60)
GFR, EST NON AFRICAN AMERICAN: 13 mL/min — AB (ref >60)
Glucose, Bld: 89 mg/dL (ref 65–99)
POTASSIUM: 4.1 mmol/L (ref 3.5–5.1)
Phosphorus: 4.3 mg/dL (ref 2.5–4.6)
Sodium: 135 mmol/L (ref 135–145)

## 2015-12-16 LAB — COMPREHENSIVE METABOLIC PANEL
ALBUMIN: 1.2 g/dL — AB (ref 3.5–5.0)
ALT: 2008 U/L — ABNORMAL HIGH (ref 14–54)
ANION GAP: 10 (ref 5–15)
AST: 984 U/L — AB (ref 15–41)
Alkaline Phosphatase: 141 U/L — ABNORMAL HIGH (ref 38–126)
BUN: 45 mg/dL — ABNORMAL HIGH (ref 6–20)
CO2: 21 mmol/L — AB (ref 22–32)
Calcium: 6.5 mg/dL — ABNORMAL LOW (ref 8.9–10.3)
Chloride: 103 mmol/L (ref 101–111)
Creatinine, Ser: 3.34 mg/dL — ABNORMAL HIGH (ref 0.44–1.00)
GFR calc Af Amer: 15 mL/min — ABNORMAL LOW (ref >60)
GFR calc non Af Amer: 13 mL/min — ABNORMAL LOW (ref >60)
GLUCOSE: 89 mg/dL (ref 65–99)
POTASSIUM: 4.1 mmol/L (ref 3.5–5.1)
SODIUM: 134 mmol/L — AB (ref 135–145)
Total Bilirubin: 0.8 mg/dL (ref 0.3–1.2)
Total Protein: 3.4 g/dL — ABNORMAL LOW (ref 6.5–8.1)

## 2015-12-16 LAB — CBC
HEMATOCRIT: 31.3 % — AB (ref 36.0–46.0)
HEMOGLOBIN: 10.5 g/dL — AB (ref 12.0–15.0)
MCH: 30.8 pg (ref 26.0–34.0)
MCHC: 33.5 g/dL (ref 30.0–36.0)
MCV: 91.8 fL (ref 78.0–100.0)
Platelets: 39 10*3/uL — ABNORMAL LOW (ref 150–400)
RBC: 3.41 MIL/uL — ABNORMAL LOW (ref 3.87–5.11)
RDW: 14.1 % (ref 11.5–15.5)
WBC: 17.7 10*3/uL — ABNORMAL HIGH (ref 4.0–10.5)

## 2015-12-16 LAB — POCT I-STAT 3, ART BLOOD GAS (G3+)
ACID-BASE DEFICIT: 6 mmol/L — AB (ref 0.0–2.0)
BICARBONATE: 20.2 meq/L (ref 20.0–24.0)
O2 Saturation: 89 %
PCO2 ART: 38.4 mmHg (ref 35.0–45.0)
PH ART: 7.326 — AB (ref 7.350–7.450)
PO2 ART: 58 mmHg — AB (ref 80.0–100.0)
Patient temperature: 97.5
TCO2: 21 mmol/L (ref 0–100)

## 2015-12-16 LAB — TYPE AND SCREEN
ABO/RH(D): O POS
Antibody Screen: NEGATIVE

## 2015-12-16 LAB — GLUCOSE, CAPILLARY
GLUCOSE-CAPILLARY: 71 mg/dL (ref 65–99)
GLUCOSE-CAPILLARY: 81 mg/dL (ref 65–99)
Glucose-Capillary: 59 mg/dL — ABNORMAL LOW (ref 65–99)
Glucose-Capillary: 65 mg/dL (ref 65–99)

## 2015-12-16 LAB — PROTIME-INR
INR: 1.39 (ref 0.00–1.49)
Prothrombin Time: 17.2 seconds — ABNORMAL HIGH (ref 11.6–15.2)

## 2015-12-16 LAB — MAGNESIUM: MAGNESIUM: 2.3 mg/dL (ref 1.7–2.4)

## 2015-12-16 SURGERY — LAPAROTOMY, EXPLORATORY
Anesthesia: General | Site: Abdomen

## 2015-12-16 MED ORDER — ATROPINE SULFATE 1 % OP SOLN: 2.0000 [drp] | OPHTHALMIC | Status: DC | PRN

## 2015-12-16 MED ORDER — PROPOFOL 10 MG/ML IV BOLUS
INTRAVENOUS | Status: AC
  Filled 2015-12-16: qty 20

## 2015-12-16 MED ORDER — FENTANYL CITRATE (PF) 250 MCG/5ML IJ SOLN
INTRAMUSCULAR | Status: AC
  Filled 2015-12-16: qty 5

## 2015-12-16 MED ORDER — POTASSIUM CHLORIDE 10 MEQ/50ML IV SOLN
INTRAVENOUS | Status: AC
  Administered 2015-12-16: 10 meq
  Filled 2015-12-16: qty 150

## 2015-12-16 MED ORDER — 0.9 % SODIUM CHLORIDE (POUR BTL) OPTIME
TOPICAL | Status: DC | PRN
  Administered 2015-12-16 (×2): 1000 mL

## 2015-12-16 MED ORDER — LACTATED RINGERS IV SOLN
INTRAVENOUS | Status: DC | PRN
Start: 2015-12-16 — End: 2015-12-16
  Administered 2015-12-16: 09:00:00 via INTRAVENOUS

## 2015-12-16 MED ORDER — SODIUM CHLORIDE 0.9 % IV SOLN
Freq: Once | INTRAVENOUS | Status: AC
  Administered 2015-12-16: 10 mL/h via INTRAVENOUS

## 2015-12-16 MED ORDER — FENTANYL CITRATE (PF) 100 MCG/2ML IJ SOLN
INTRAMUSCULAR | Status: DC | PRN
  Administered 2015-12-16 (×2): 100 ug via INTRAVENOUS

## 2015-12-16 MED ORDER — POTASSIUM CHLORIDE 10 MEQ/50ML IV SOLN
INTRAVENOUS | Status: AC
  Filled 2015-12-16: qty 50

## 2015-12-16 MED ORDER — ROCURONIUM BROMIDE 50 MG/5ML IV SOLN
INTRAVENOUS | Status: AC
  Filled 2015-12-16: qty 1

## 2015-12-16 MED ORDER — PHENYLEPHRINE HCL 10 MG/ML IJ SOLN
INTRAMUSCULAR | Status: DC | PRN
  Administered 2015-12-16 (×2): 80 ug via INTRAVENOUS

## 2015-12-16 MED ORDER — ATROPINE SULFATE 1 % OP SOLN
2.0000 [drp] | OPHTHALMIC | Status: DC | PRN
  Administered 2015-12-16 – 2015-12-17 (×3): 2 [drp] via SUBLINGUAL
  Filled 2015-12-16: qty 2

## 2015-12-16 MED ORDER — MIDAZOLAM HCL 2 MG/2ML IJ SOLN
INTRAMUSCULAR | Status: AC
  Filled 2015-12-16: qty 2

## 2015-12-16 MED ORDER — MIDAZOLAM HCL 5 MG/5ML IJ SOLN
INTRAMUSCULAR | Status: DC | PRN
  Administered 2015-12-16: 2 mg via INTRAVENOUS

## 2015-12-16 MED ORDER — DEXTROSE 50 % IV SOLN
25.0000 mL | Freq: Once | INTRAVENOUS | Status: AC
  Administered 2015-12-16: 25 mL via INTRAVENOUS

## 2015-12-16 MED ORDER — DEXTROSE 50 % IV SOLN
INTRAVENOUS | Status: AC
  Filled 2015-12-16: qty 50

## 2015-12-16 MED ORDER — ROCURONIUM BROMIDE 100 MG/10ML IV SOLN
INTRAVENOUS | Status: DC | PRN
  Administered 2015-12-16: 50 mg via INTRAVENOUS

## 2015-12-16 MED ORDER — SUCCINYLCHOLINE CHLORIDE 200 MG/10ML IV SOSY
PREFILLED_SYRINGE | INTRAVENOUS | Status: AC
  Filled 2015-12-16: qty 10

## 2015-12-16 SURGICAL SUPPLY — 47 items
BLADE SURG ROTATE 9660 (MISCELLANEOUS) IMPLANT
CANISTER SUCTION 2500CC (MISCELLANEOUS) ×4 IMPLANT
CANISTER WOUND CARE 500ML ATS (WOUND CARE) ×4 IMPLANT
CHLORAPREP W/TINT 26ML (MISCELLANEOUS) IMPLANT
COVER SURGICAL LIGHT HANDLE (MISCELLANEOUS) ×4 IMPLANT
DRAPE LAPAROSCOPIC ABDOMINAL (DRAPES) ×4 IMPLANT
DRAPE WARM FLUID 44X44 (DRAPE) IMPLANT
DRSG OPSITE POSTOP 4X10 (GAUZE/BANDAGES/DRESSINGS) IMPLANT
DRSG OPSITE POSTOP 4X8 (GAUZE/BANDAGES/DRESSINGS) IMPLANT
DRSG VAC ATS LRG SENSATRAC (GAUZE/BANDAGES/DRESSINGS) ×4 IMPLANT
ELECT BLADE 6.5 EXT (BLADE) ×4 IMPLANT
ELECT CAUTERY BLADE 6.4 (BLADE) ×4 IMPLANT
ELECT REM PT RETURN 9FT ADLT (ELECTROSURGICAL) ×4
ELECTRODE REM PT RTRN 9FT ADLT (ELECTROSURGICAL) ×2 IMPLANT
GLOVE BIO SURGEON STRL SZ7 (GLOVE) ×8 IMPLANT
GLOVE BIO SURGEON STRL SZ7.5 (GLOVE) ×4 IMPLANT
GLOVE BIOGEL PI IND STRL 7.0 (GLOVE) ×4 IMPLANT
GLOVE BIOGEL PI IND STRL 7.5 (GLOVE) ×2 IMPLANT
GLOVE BIOGEL PI INDICATOR 7.0 (GLOVE) ×4
GLOVE BIOGEL PI INDICATOR 7.5 (GLOVE) ×2
GLOVE ECLIPSE 7.5 STRL STRAW (GLOVE) ×4 IMPLANT
GOWN STRL REUS W/ TWL LRG LVL3 (GOWN DISPOSABLE) ×8 IMPLANT
GOWN STRL REUS W/TWL LRG LVL3 (GOWN DISPOSABLE) ×8
GOWN STRL REUS W/TWL XL LVL3 (GOWN DISPOSABLE) ×4 IMPLANT
KIT BASIN OR (CUSTOM PROCEDURE TRAY) ×4 IMPLANT
KIT ROOM TURNOVER OR (KITS) ×4 IMPLANT
LIGASURE IMPACT 36 18CM CVD LR (INSTRUMENTS) ×4 IMPLANT
NS IRRIG 1000ML POUR BTL (IV SOLUTION) ×8 IMPLANT
PACK GENERAL/GYN (CUSTOM PROCEDURE TRAY) ×4 IMPLANT
PAD ARMBOARD 7.5X6 YLW CONV (MISCELLANEOUS) ×4 IMPLANT
RELOAD PROXIMATE 75MM BLUE (ENDOMECHANICALS) ×24 IMPLANT
RELOAD PROXIMATE TA60MM BLUE (ENDOMECHANICALS) ×4 IMPLANT
SPECIMEN JAR LARGE (MISCELLANEOUS) ×4 IMPLANT
SPONGE LAP 18X18 X RAY DECT (DISPOSABLE) IMPLANT
STAPLER GUN LINEAR PROX 60 (STAPLE) ×4 IMPLANT
STAPLER PROXIMATE 75MM BLUE (STAPLE) ×4 IMPLANT
STAPLER VISISTAT 35W (STAPLE) IMPLANT
SUCTION POOLE TIP (SUCTIONS) ×4 IMPLANT
SUT PDS AB 1 TP1 96 (SUTURE) ×8 IMPLANT
SUT SILK 2 0 SH CR/8 (SUTURE) ×4 IMPLANT
SUT SILK 2 0 TIES 10X30 (SUTURE) ×4 IMPLANT
SUT SILK 3 0 SH CR/8 (SUTURE) ×12 IMPLANT
SUT SILK 3 0 TIES 10X30 (SUTURE) IMPLANT
SUT VIC AB 3-0 SH 18 (SUTURE) IMPLANT
TOWEL OR 17X26 10 PK STRL BLUE (TOWEL DISPOSABLE) ×4 IMPLANT
TRAY FOLEY CATH 16FRSI W/METER (SET/KITS/TRAYS/PACK) IMPLANT
YANKAUER SUCT BULB TIP NO VENT (SUCTIONS) IMPLANT

## 2015-12-16 NOTE — Progress Notes (Signed)
Central line dressing changed per protocol. Sterile technique maintained. Assisted by Paris Lore RN.

## 2015-12-16 NOTE — Progress Notes (Signed)
Chaplain Note:  Received consult request due to end of life care.  I met with family including sons and their wives and two grandchildren. Family are sad but accepting of pts. Condition and understand there is nothing more to be done to help her.  They described her as having fought through many previous health issues but this was too much for her. 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 THe family interactions appeared to indicate strong bonding and support of each other.   Family also indicated that they have strong support from their church pastor who has been present with them for extended periods of time and in fact called while i was with them.   With their permission I offered a prayer for the patient and for them and indicated our staff availability to be of support.   Sue Lush.  [phone]

## 2015-12-16 NOTE — Progress Notes (Signed)
eLink Physician-Brief Progress Note Patient Name: [name] DOB: [birth year] MRN: [id number]   Date of Service  12/24/2015  HPI/Events of Note  Oral secretions - Patient is comfort care.  eICU Interventions  Will order Atropine 2 drops sublingual Q 4 hours PRN.      Intervention Category Intermediate Interventions: Other:  Lysle Dingwall 01/07/2016, 6:48 PM

## 2015-12-16 NOTE — Op Note (Signed)
Preop diagnosis:  Open abdomen status post small bowel resection for small bowel Postop diagnosis: Ischemic small bowel Procedure performed: Exploratory laparotomy with additional small bowel resection; creation of small bowel anastomoses 2 with closure of the abdominal wall.  Surgeon:Lachanda Buczek K. Anesthesia: Gen. endotracheal Indications: This is a 70 year old female with multiple medical problems who presented emergently on 12/06/2015 with peritonitis. She underwent emergent exporter laparotomy with resection of a proximally 30 cm of ileum that was grossly infarcted. However there were scattered ischemic changes throughout the remainder of the small bowel and the cecum. We returned to the operating room 48 hours later. She had further longer segments of small bowel that were necrotic. These were resected and her abdomen was left open again. She has progressed into respiratory failure, renal failure, and significant hepatic dysfunction. She comes back today for another exploration.  Operative findings: The liver appears gray and discolored with patches of purplish discoloration. There are further segments of small bowel that are necrotic but there is no sign of perforation. There are still scattered ecchymotic areas throughout the remaining small bowel. The cecum has an area of ecchymosis but no sign of perforation.  Description of procedure: The patient is brought to the operating room placed in supine position on the operating room table. After an adequate level of general anesthesia was obtained, her abdominal vacuum dressing was removed. Her abdomen was prepped with Betadine and draped sterile fashion. A timeout was taken to ensure the proper patient and proper procedure. We removed the previous vacuum dressing and explored the abdomen. There is no sign of perforation. The liver appears to be mildly ischemic with a grayish color. There are patches of darker purplish discoloration scattered across  the right lobe of the liver. We identify the small bowel at the ligament of Treitz. The proximal 60 cm of jejunum seems to be viable with only small scattered patches of ecchymosis. However distal to this point there is further evidence of ischemic bowel. This extends all the way down to our previous staple line. The distal segments also examined and there is further evidence of ischemic bowel. This is a short segment in the mid ileum. The terminal ileum seems to be viable for about an additional 15 cm. The cecum is viable but there is areas of patchy discoloration but no sign of perforation. The right colon was mobilized and appears viable. Transverse colon descending colon and sigmoid colon all appeared viable. We irrigated the abdomen thoroughly. We oversewed the patchy discolored area in the cecum with multiple 3-0 silk sutures. We resected the segment of terminal ileum that appeared to be ischemic. We created a side-to-side anastomosis with a GIA-75 stapler and a TA 60 stapler. We then resected the ischemic area in the more proximal small bowel. We made the decision to proceed with small bowel anastomosis as this patient has a very grim prognosis. We'll have further discussions with the family regarding palliative care. We will attempt to reconnect her and to close her fascia to hopefully avoid the need for further surgery. There are still some areas of remaining small bowel that have some patchy discoloration but currently are not grossly necrotic.  We created our more proximal small bowel anastomosis in similar side-to-side fashion with a GIA-75 and a TA 60 stapler. The staple lines were oversewn with 3-0 silk. The mesenteric defects were closed with 2-0 silk. We irrigated the abdomen thoroughly and inspected for hemostasis. The fascia was then reapproximated with double-stranded #1 PDS suture. A  large VAC sponge was cut to fit the abdominal incision. This was sealed with an occlusive drape and placed a 1  25 mmHg suction. The patient is then transported back to the intensive care unit in critical condition. All sponge, instrument, and needle counts are correct.  Imogene Burn. Georgette Dover, MD, [facility] Surgery  General/ Trauma Surgery  12/29/2015 11:01 AM

## 2015-12-16 NOTE — Addendum Note (Signed)
Addendum  created 12/15/2015 1247 by Nolon Nations, MD   Modules edited: Anesthesia LDA, Lines/Drains/Airways Properties Editor   Lines/Drains/Airways Properties Editor:  Properties of line/drain/airway/wound Arterial Line 12/23/2015 Left Pedal have been modified.; Properties of line/drain/airway/wound Arterial Line 01/03/2016 Left Pedal have been modified.

## 2015-12-16 NOTE — Anesthesia Preprocedure Evaluation (Addendum)
Anesthesia Evaluation  Patient identified by MRN, date of birth, ID band  Reviewed: Allergy & Precautions, NPO status , Patient's Chart, lab work & pertinent test results  Airway Mallampati: Intubated       Dental   Pulmonary Current Smoker,     + decreased breath sounds      Cardiovascular  Rhythm:Regular     Neuro/Psych    GI/Hepatic   Endo/Other  diabetes  Renal/GU      Musculoskeletal   Abdominal   Peds  Hematology   Anesthesia Other Findings   Reproductive/Obstetrics                            Anesthesia Physical Anesthesia Plan  ASA: III  Anesthesia Plan: General   Post-op Pain Management:    Induction: Inhalational  Airway Management Planned: Oral ETT  Additional Equipment: None  Intra-op Plan:   Post-operative Plan: Post-operative intubation/ventilation  Informed Consent: I have reviewed the patients History and Physical, chart, labs and discussed the procedure including the risks, benefits and alternatives for the proposed anesthesia with the patient or authorized representative who has indicated his/her understanding and acceptance.     Plan Discussed with: CRNA  Anesthesia Plan Comments:        Anesthesia Quick Evaluation                                  Anesthesia Evaluation  Patient identified by MRN, date of birth, ID band Patient awake    Reviewed: Allergy & Precautions, NPO status , Patient's Chart, lab work & pertinent test results, Unable to perform ROS - Chart review only  Airway Mallampati: Intubated       Dental no notable dental hx.    Pulmonary COPD, Current Smoker,    Pulmonary exam normal breath sounds clear to auscultation       Cardiovascular negative cardio ROS Normal cardiovascular exam Rhythm:Regular Rate:Normal     Neuro/Psych negative neurological ROS  negative psych ROS   GI/Hepatic negative GI ROS, Neg  liver ROS,   Endo/Other  diabetes  Renal/GU negative Renal ROS  negative genitourinary   Musculoskeletal negative musculoskeletal ROS (+)   Abdominal   Peds negative pediatric ROS (+)  Hematology negative hematology ROS (+)   Anesthesia Other Findings   Reproductive/Obstetrics negative OB ROS                             Anesthesia Physical Anesthesia Plan  ASA: III  Anesthesia Plan: General   Post-op Pain Management:    Induction: Intravenous  Airway Management Planned: Oral ETT  Additional Equipment:   Intra-op Plan:   Post-operative Plan: Post-operative intubation/ventilation  Informed Consent: I have reviewed the patients History and Physical, chart, labs and discussed the procedure including the risks, benefits and alternatives for the proposed anesthesia with the patient or authorized representative who has indicated his/her understanding and acceptance.     Plan Discussed with: CRNA  Anesthesia Plan Comments:         Anesthesia Quick Evaluation

## 2015-12-16 NOTE — Transfer of Care (Signed)
Immediate Anesthesia Transfer of Care Note  Patient: Breanna Warren  Procedure(s) Performed: Procedure(s): EXPLORATORY LAPAROTOMY (N/A) SMALL BOWEL RESECTION WITH ANASTAMOSIS TIMES TWO APPLICATION OF INCISIONAL WOUND VAC (N/A)  Patient Location: ICU  Anesthesia Type:General  Level of Consciousness: unresponsive and Patient remains intubated per anesthesia plan  Airway & Oxygen Therapy: Patient remains intubated per anesthesia plan and Patient placed on Ventilator (see vital sign flow sheet for setting)  Post-op Assessment: Report given to RN and Post -op Vital signs reviewed and stable  Post vital signs: Reviewed and stable  Last Vitals:  Filed Vitals:   01/05/2016 0845 12/28/2015 0900  BP: 133/71   Pulse: 84 92  Temp: 36.9 C   Resp: 30 28    Last Pain:  Filed Vitals:   12/22/2015 0934  PainSc: 10-Worst pain ever         Complications: No apparent anesthesia complications

## 2015-12-16 NOTE — Plan of Care (Signed)
Problem: Skin Integrity Impairment Risk: Goal: Risk for impaired skin integrity will decrease Outcome: Progressing Pt very swollen and skin very fragle will cover any skin tears with meplex

## 2015-12-16 NOTE — Progress Notes (Signed)
PULMONARY / CRITICAL CARE MEDICINE   Name: Breanna Warren MRN: [id number] DOB: August 04, 1946    ADMISSION DATE:  11/25/2015 CONSULTATION DATE:  11/26/2015  REFERRING MD:  Dr. Wyvonnia Dusky   CHIEF COMPLAINT:  Abdominal Pain    SUBJECTIVE:  RN reports neo weaned to 30 mcg, remains on CVVHD, planned return to OR for wash out, platelets infusing   VITAL SIGNS: BP 101/55 mmHg  Pulse 83  Temp(Src) 98.3 F (36.8 C) (Axillary)  Resp 28  Ht 5\' 1"  (1.549 m)  Wt 140 lb 10.5 oz (63.8 kg)  BMI 26.59 kg/m2  SpO2 100%  HEMODYNAMICS: CVP:  [5 mmHg-7 mmHg] 6 mmHg  VENTILATOR SETTINGS: Vent Mode:  [-] PRVC FiO2 (%):  [40 %] 40 % Set Rate:  [30 bmp] 30 bmp Vt Set:  [400 mL] 400 mL PEEP:  [5 cmH20] 5 cmH20 Plateau Pressure:  [16 cmH20-21 cmH20] 17 cmH20  INTAKE / OUTPUT: I/O last 3 completed shifts: In: 5615.1 [I.V.:4495.1; NG/GT:150; IV Piggyback:970] Out: 4365 [Urine:214; Emesis/NG output:1525; Drains:1050; Other:1576]  PHYSICAL EXAMINATION: General:  Small adult female on vent, appears critically ill  HENT : NCAT, ETT in place PULM: even/non-labored, lungs bilaterally with rhonchi CV: RRR, No m/r/g, R thumb/index finger dusky/cool, low cap refill GI: BS infrequent, wound vac in place Ext: warm, generalized edema Neuro: sedated, minimal response to pain Skin:  Dusky toes bilaterally with cap refill <4sec  LABS:  BMET  Recent Labs Lab 12/15/15 0600 12/15/15 1500 01/13/2016 0400  NA 136 133* 134*  135  K 4.2 3.7 4.1  4.1  CL 103 106 103  104  CO2 18* 17* 21*  21*  BUN 65* 64* 45*  45*  CREATININE 4.69* 4.45* 3.34*  3.33*  GLUCOSE 57* 158* 89  89    Electrolytes  Recent Labs Lab 12/13/15 0520  01/09/2016 0514 12/15/15 0600 12/15/15 1500 12/28/2015 0400  CALCIUM 5.3*  < >  --  5.7* 5.5* 6.5*  6.5*  MG 1.2*  --  2.6*  --   --  2.3  PHOS 3.7  --  8.0*  --  6.5* 4.3  < > = values in this interval not displayed.  CBC  Recent Labs Lab 12/13/15 0350 01/06/2016 0350  01/13/2016 0400  WBC 4.4 10.7* 17.7*  HGB 13.5 12.2 10.5*  HCT 41.3 37.4 31.3*  PLT 129* 74* 39*    Coag's  Recent Labs Lab 11/18/2015 0849 12/14/2015 0400  INR 1.26 1.39    Sepsis Markers  Recent Labs Lab 12/02/2015 0732 12/07/2015 1005 11/22/2015 1540 12/13/15 0520 01/06/2016 0350  LATICACIDVEN 13.00* 10.83* 3.2*  --   --   PROCALCITON  --   --  33.95 115.02 151.59    ABG  Recent Labs Lab 11/14/2015 2243 12/13/15 0354 01/08/2016 0512  PHART 7.311* 7.289* 7.225*  PCO2ART 37.9 32.3* 38.6  PO2ART 117.0* 97.0 92.0    Liver Enzymes  Recent Labs Lab 11/29/2015 1023 12/15/15 0600 12/15/15 1500 12/19/2015 0400  AST 51* 2541*  --  984*  ALT <5* 3067*  --  2008*  ALKPHOS 83 116  --  141*  BILITOT 0.9 0.5  --  0.8  ALBUMIN 3.1* 1.2* <1.0* 1.2*  1.2*    Cardiac Enzymes  Recent Labs Lab 11/30/2015 1714 11/20/2015 2055 12/13/15 0520  TROPONINI 0.78* 0.68* 1.05*    Glucose  Recent Labs Lab 12/15/15 1606 12/15/15 1654 12/15/15 2134 12/15/15 2216 12/15/15 2328 12/31/2015 0359  GLUCAP 69 85 63* 82 71 81    Imaging  Dg Chest Port 1 View  01/05/2016  CLINICAL DATA:  Acute respiratory failure EXAM: PORTABLE CHEST 1 VIEW COMPARISON:  Portable chest x-ray of 12/15/2015 and CT of the chest of 12/11/2015 FINDINGS: Aeration of the lungs has improved somewhat. Bibasilar opacities remain consistent with atelectasis some possible airspace disease as well as tiny effusions. Endotracheal tear in central venous lines are unchanged in position. Heart size is stable. IMPRESSION: 1. Slightly better aeration. 2. Little change in bibasilar opacities consistent with atelectasis, probable small effusions, at a possible airspace disease Electronically Signed   By: Ivar Drape M.D.   On: 01/01/2016 07:58   Dg Chest Port 1 View  12/15/2015  CLINICAL DATA:  Central catheter placement.  Hypoxia. EXAM: PORTABLE CHEST 1 VIEW COMPARISON:  Study obtained earlier in the day FINDINGS: Left-sided central  catheter tip is in the left innominate vein. Right central catheter tip is in the superior vena cava. Nasogastric tube tip and side port are below the diaphragm. Endotracheal tube tip is 6.3 cm above the carina. No pneumothorax. There is underlying interstitial edema with patchy alveolar edema in the right lower lobe. The lungs elsewhere clear. Heart size and pulmonary vascularity are normal. No adenopathy. Postoperative changes noted in the left clavicle region, stable. IMPRESSION: Tube and catheter positions as described without pneumothorax. Note that the new central catheter tip is in the left innominate vein. There is a degree of underlying interstitial edema with patchy alveolar opacity in the right base. A degree of pneumonia in the right base must be of concern. Stable cardiac silhouette. Electronically Signed   By: Lowella Grip III M.D.   On: 12/15/2015 13:10     STUDIES:  CT Chest w/o 4/29 >> hazy & patchy airspace opacities in RLL, ? Aspiration with dilated esophagus, volume loss LLL CT ABD/Pelvis w/o 4/29 >> partial small bowel obstruction, no obvious obstructing mass, enlarging R lobe liver lesion 16 mm ECHO 5/1 >> LV nml size, EF 65-70%, no RWMA  CULTURES: BCx2 4/29 >>  UA 4/29 >> not done  ANTIBIOTICS: Cefepime 4/29 >>  Vanc 4/29  >> Flagyl 4/29 >> Diflucan 4/29 >>  SIGNIFICANT EVENTS: 4/29  Admit with abdominal pain, lactic acid 13, concern for SBO  4/29  Ex-lap: small bowel resection and wound VAC placed 5/01  Remains on vent, AGMA, AKI, on neo.  Returned to OR for re-exploration, necrotic bowel resection (50 cm) 5/03  Return to OR  LINES/TUBES: ETT 4/29 >>  R IJ 4/29 >>  DISCUSSION: [name], smoker, admitted 4/30 with abdominal pain.  Found to have lactic acid of 13, CT concerning for SBO and AKI.  CCS evaluated and taken directly for exploratory laparotomy which showed ischemic bowel, left open post operatively.    ASSESSMENT / PLAN:  PULMONARY A: RLL  Opacity  Bibasilar Atelectasis  COPD - not defined, ongoing tobacco abuse Tobacco Abuse  P:   Full vent support, PRVC 8 cc/kg  Wean PEEP / FiO2 for sats > 88-95% Monitor for auto peep  Daily SBT/WUA/ABG/CXR VAP prevention measures   CARDIOVASCULAR A:  Elevated Troponin - demand ischemia, EKG with non-specific ST wave changes Hx HTN  Septic shock R hand dusky - radial a-line d/c'd P:  Wean neosynephrine to off for MAP >65  Hold home lasix  CVP monitoring Monitor aline site   RENAL A:   Acute Kidney Injury - in setting of SBO, hypovolemia with decreased intake Hypokalemia Anion Gap Acidosis / Lactic Acidosis  Hypocalcemia  P:  Trend BMP / UOP  Replace electrolytes as indicated - Ca 5/3 corrects to 8.7 D51/2 NS @ 75 ml/hr Nephrology following, appreciate input     GASTROINTESTINAL A:   Abdominal Pain - s/p exploratory laparotomy with small bowel resection in setting of bowel infarct / SBO, VAC placement Suspected SBO  Transaminitis - suspected shock liver P:   NPO   PPI for SUP  Post-op care per CCS VAC care per WOC/CCS Pending return visit 5/3 to OR, may need to discuss [place] with family  HEMATOLOGIC A:   Leukocytosis  Thrombocytopenia - likely from shock P:  SCD's for DVT prophylaxis  Monitor platelets / evidence of bleeding Hold heparin prophylaxis until cleared with CCS  INFECTIOUS A:   Peritonitis  RLL Opacity - likely atelectasis  P:   Cultures / abx as above Antifungal as above  ENDOCRINE A:   DM    P:   Q4 CBG with SSI   NEUROLOGIC A:   Pain  P:   RASS goal: -1 to -2 Fentanyl gtt per PAD protocol   FAMILY  - Updates: No family at bedside.    - Inter-disciplinary family meet or Palliative Care meeting due by:  5/6   Noe Gens, NP-C [place] Pgr: [phone] or if no answer [phone] 12/15/2015, 8:38 AM

## 2015-12-16 NOTE — Progress Notes (Signed)
[facility] PCCM  I had a conversation with the family.  They desire withdrawal of care today.   Will attempt SBT, if fails then will need to extubate to a fentanyl gtt.  Roselie Awkward, MD [place] P[facility]M Pager: [phone] Cell: [phone] After 3pm or if no response, call [phone]

## 2015-12-16 NOTE — Progress Notes (Signed)
Hypoglycemic Event  CBG:63   Treatment: D50 IV 25 mL  Symptoms: None  Follow-up CBG: Time:2210 CBG Result:82  Possible Reasons for Event: Inadequate meal intake  Comments/MD notified:no    Breanna Warren, Armando Reichert

## 2015-12-16 NOTE — Procedures (Signed)
Extubation Procedure Note  Patient Details:   Name: Breanna Warren DOB: 1946/05/17 MRN: [id number]   Airway Documentation:     Evaluation  O2 sats: stable throughout Complications: No apparent complications Patient did tolerate procedure well. Bilateral Breath Sounds: Rhonchi   No   Patient was extubated to a 1L [place] per end of life withdrawal orders. RT put patient on a 1L  due to patient having RR of 12 on PS/CPAP 5/5 and for family comfort. RN and family were at the bedside with RT during extubation.  Tiburcio Bash 12/22/2015, 4:11 PM

## 2015-12-16 NOTE — Anesthesia Postprocedure Evaluation (Signed)
Anesthesia Post Note  Patient: Breanna Warren  Procedure(s) Performed: Procedure(s) (LRB): EXPLORATORY LAPAROTOMY (N/A) SMALL BOWEL RESECTION WITH ANASTAMOSIS TIMES TWO APPLICATION OF INCISIONAL WOUND VAC (N/A)  Patient location during evaluation: ICU Anesthesia Type: General Level of consciousness: sedated and patient remains intubated per anesthesia plan Pain management: pain level controlled Vital Signs Assessment: post-procedure vital signs reviewed and stable Respiratory status: patient remains intubated per anesthesia plan Cardiovascular status: stable Anesthetic complications: no    Last Vitals:  Filed Vitals:   01/06/2016 1145 01/02/2016 1207  BP: 130/66 127/62  Pulse: 83 85  Temp: 36.3 C 36.4 C  Resp: 30 30    Last Pain:  Filed Vitals:   12/26/2015 1229  PainSc: 10-Worst pain ever                 Nolon Nations

## 2015-12-16 NOTE — Progress Notes (Signed)
Assessment/Plan:  70 year old WF with likely lung compromise from tobacco abuse who presents with ischemic bowel s/p resectionx2 complicated by hypotension requiring pressors, lactic acidosis and AKI.  1. AKI, oliguirc-unknown baseline renal function- was normal several years ago- presented with creatinine 1.9, now worsening; Started CVVHD yesterday. UOP 127m over the past 24 hours.   2. Hypertension/volume - soft. on pressors, getting volume.  3. Hyperkalemia- improved 4. Metabolic acidosis- 2/2 lactic acidosis. Improving. On sodium acetate 5. Hypocalcemia-  56.5 this morning, calcium gluconate ordered.   Subjective: Intubated. Does not respond to commands.   Objective: Vital signs in last 24 hours: Temp:  [97.3 F (36.3 C)-99.5 F (37.5 C)] 97.3 F (36.3 C) (05/03 0449) Pulse Rate:  [30-102] 83 (05/03 0746) Resp:  [23-32] 28 (05/03 0746) BP: (92-151)/(46-83) 101/55 mmHg (05/03 0746) SpO2:  [96 %-100 %] 100 % (05/03 0746) FiO2 (%):  [40 %] 40 % (05/03 0746) Weight:  [140 lb 10.5 oz (63.8 kg)] 140 lb 10.5 oz (63.8 kg) (05/03 0300) Weight change: 3.5 oz (0.1 kg)  Intake/Output from previous day: 05/02 0701 - 05/03 0700 In: 3781.1 [I.V.:2821.1; NG/GT:90; IV Piggyback:870] Out: 3816 [Urine:140; Emesis/NG output:1500; Drains:600] Intake/Output this shift:    General: sedated on vent Neck: no JVD Heart: RRR Lungs: intubated, coarse breath sounds bilaterally Abdomen: wound vac in place Extremities: no edema Skin: warm and dry Neuro: sedated   Lab Results:  Recent Labs  12/27/2015 0350 12/20/2015 0400  WBC 10.7* 17.7*  HGB 12.2 10.5*  HCT 37.4 31.3*  PLT 74* 39*   BMET:   Recent Labs  12/15/15 1500 01/12/2016 0400  NA 133* 134*  135  K 3.7 4.1  4.1  CL 106 103  104  CO2 17* 21*  21*  GLUCOSE 158* 89  89  BUN 64* 45*  45*  CREATININE 4.45* 3.34*  3.33*  CALCIUM 5.5* 6.5*  6.5*   No results for input(s): PTH in the last 72 hours. Iron Studies: No  results for input(s): IRON, TIBC, TRANSFERRIN, FERRITIN in the last 72 hours. Studies/Results: Dg Chest Port 1 View  01/02/2016  CLINICAL DATA:  Acute respiratory failure EXAM: PORTABLE CHEST 1 VIEW COMPARISON:  Portable chest x-ray of 12/15/2015 and CT of the chest of 11/17/2015 FINDINGS: Aeration of the lungs has improved somewhat. Bibasilar opacities remain consistent with atelectasis some possible airspace disease as well as tiny effusions. Endotracheal tear in central venous lines are unchanged in position. Heart size is stable. IMPRESSION: 1. Slightly better aeration. 2. Little change in bibasilar opacities consistent with atelectasis, probable small effusions, at a possible airspace disease Electronically Signed   By: PIvar DrapeM.D.   On: 01/12/2016 07:58   Dg Chest Port 1 View  12/15/2015  CLINICAL DATA:  Central catheter placement.  Hypoxia. EXAM: PORTABLE CHEST 1 VIEW COMPARISON:  Study obtained earlier in the day FINDINGS: Left-sided central catheter tip is in the left innominate vein. Right central catheter tip is in the superior vena cava. Nasogastric tube tip and side port are below the diaphragm. Endotracheal tube tip is 6.3 cm above the carina. No pneumothorax. There is underlying interstitial edema with patchy alveolar edema in the right lower lobe. The lungs elsewhere clear. Heart size and pulmonary vascularity are normal. No adenopathy. Postoperative changes noted in the left clavicle region, stable. IMPRESSION: Tube and catheter positions as described without pneumothorax. Note that the new central catheter tip is in the left innominate vein. There is a degree of underlying interstitial  edema with patchy alveolar opacity in the right base. A degree of pneumonia in the right base must be of concern. Stable cardiac silhouette. Electronically Signed   By: Lowella Grip III M.D.   On: 12/15/2015 13:10   Dg Chest Port 1 View  12/15/2015  CLINICAL DATA:  Post exploratory laparotomy and  small bowel resection for bowel infarction, acute respiratory failure, diabetes mellitus, COPD EXAM: PORTABLE CHEST 1 VIEW COMPARISON:  Portable exam 0613 hours compared to 01/13/2016 FINDINGS: Tip of endotracheal tube projects 5.6 cm above carina. Nasogastric tube extends into stomach. RIGHT jugular central venous catheter tip projects over SVC. Numerous EKG leads project over upper chest. Normal heart size, mediastinal contours, and pulmonary vascularity. Persistent bibasilar infiltrates, little changed. Upper lungs grossly clear. No definite pleural effusion or pneumothorax. Prior plating of the distal LEFT clavicle. IMPRESSION: Persistent bibasilar infiltrates. Electronically Signed   By: Lavonia Dana M.D.   On: 12/15/2015 07:34    Scheduled: . sodium chloride   Intravenous Once  . antiseptic oral rinse  7 mL Mouth Rinse QID  . ceFEPime (MAXIPIME) IV  1 g Intravenous Q12H  . chlorhexidine gluconate (SAGE KIT)  15 mL Mouth Rinse BID  . fentaNYL (SUBLIMAZE) injection  50 mcg Intravenous Once  . fluconazole (DIFLUCAN) IV  200 mg Intravenous Q24H  . insulin aspart  0-9 Units Subcutaneous Q4H  . metronidazole  500 mg Intravenous Q6H  . pantoprazole (PROTONIX) IV  40 mg Intravenous Q12H  . sodium chloride  1,000 mL Intravenous Once  . sodium chloride  1,000 mL Intravenous Once  . sodium chloride  1,000 mL Intravenous Once  . vancomycin  1,000 mg Intravenous Q24H    LOS: 4 days   Dimas Chyle 01/07/2016,8:10 AM    Renal As above. Recent events reviewed and current plan includes withdrawal of aggressive care.  We will sign off. Jamarrius Salay C

## 2015-12-16 NOTE — Progress Notes (Signed)
Attempt made to see if patient would wake up following surgical procedure (anesthesia stop at 1109)' and turning off sedation (fentanyl) at 1215.  Attempted SBT wean once at 1230 and again at 1415.  Patient unable to reach above 7 breaths per minute.  Patient not waking to stimuli or responsive.  Notified Dr. [place] Bells who suggested to family restarting sedation and extubating for comfort.  Family agrees with doctor's recommendation.  Fentanyl restarted at 1425 at 164mcg.  Will extubate for comfort when family is ready. Bobette Mo

## 2015-12-17 ENCOUNTER — Encounter (HOSPITAL_COMMUNITY): Payer: Self-pay | Admitting: Surgery

## 2015-12-17 DIAGNOSIS — Z515 Encounter for palliative care: Secondary | ICD-10-CM

## 2015-12-17 LAB — PREPARE PLATELET PHERESIS
UNIT DIVISION: 0
Unit division: 0

## 2015-12-17 LAB — CULTURE, BLOOD (ROUTINE X 2)
Culture: NO GROWTH
Culture: NO GROWTH

## 2015-12-17 MED ORDER — SCOPOLAMINE 1 MG/3DAYS TD PT72
1.0000 | MEDICATED_PATCH | TRANSDERMAL | Status: DC
  Administered 2015-12-17: 1.5 mg via TRANSDERMAL
  Filled 2015-12-17: qty 1

## 2015-12-24 ENCOUNTER — Telehealth: Payer: Self-pay

## 2015-12-24 NOTE — Telephone Encounter (Signed)
On 12/24/2015 I received a death certificate from [facility] (original). The death certificate is for burial. The patient is a patient of Doctor McQuaid. The death certificate will be taken to [facility]) this am for signature. On 01-04-16 I received the death certificate back from Doctor McQuaid. I got the death certificate ready and called the funeral home to let them know I mailed the death certificate to the [facility] per their request.

## 2016-01-14 NOTE — Progress Notes (Addendum)
140cc Fentanyl (41mcg/ml) wasted in sharps. Witnessed by 2nd RN Henrietta Dine.

## 2016-01-14 NOTE — Progress Notes (Signed)
Pt noted to be asystole on monitor.  Patient without respirations, pulse/heart sounds, or pupillary response.  Verified by 2 RNs Warden Fillers RN and Currie Paris RN.  Family at bedside and aware.

## 2016-01-14 NOTE — Discharge Summary (Signed)
[facility] pulmonary critical care death note  Date of admission 10-Jan-2016 Date of death 01/15/2016  Cause of death: Ischemic bowel Peritonitis Septic shock Acute kidney injury Acute restaurant failure with hypoxemia  Brief history of present illness and hospital course: This is a 70 year old female with COPD who came to [facility] on 2016-01-10 complaining of abdominal pain. She was noted to have findings consistent with septic shock on admission. CT scan of her abdomen also showed findings which are worrisome for ischemic bowel. Surgery was consulted and she was taken for an exploratory laparotomy which showed multiple areas of necrotic bowel. She underwent a resection and was treated aggressively with IV fluids, antibiotics and mechanical ventilation. She did up going back to the operating room 3 times for washouts and each time she required further resection of small bowel because of progressive ischemic injury. She developed worsening renal failure requiring continuous vena vena hemodialysis. After the last small bowel resection was determined that she was at the point where she would have short gut syndrome. We discussed this with the patient's family considering her grave prognosis and they stated clearly that she would never want this level of care and would not want to live dependent on others or with IV nutrition. Based on this we withdrew care and provided comfort measures. She died peacefully with her family at the bedside on 2016/01/15.  Roselie Awkward, MD [place] PCCM Pager: [phone] After 3pm or if no response, call [phone]

## 2016-01-14 NOTE — Progress Notes (Signed)
1 Day Post-Op  Subjective: POD 1 s/p re-exploration with additional small bowel resection now with short bowel.  Family discussion held and wishing to transition to comfort care.  Awaiting floor bed presently.  Objective: Vital signs in last 24 hours: Temp:  [97.3 F (36.3 C)-97.7 F (36.5 C)] 97.7 F (36.5 C) (05/03 1325) Pulse Rate:  [83-110] 110 (05/04 0800) Resp:  [6-30] 6 (05/04 0800) BP: (117-140)/(50-66) 140/50 mmHg (05/03 1325) SpO2:  [76 %-100 %] 76 % (05/04 0800) Arterial Line BP: (71-185)/(38-70) 71/39 mmHg (05/04 0800) FiO2 (%):  [40 %] 40 % (05/03 1207) Weight:  [63.8 kg (140 lb 10.5 oz)-65.363 kg (144 lb 1.6 oz)] 65.363 kg (144 lb 1.6 oz) (05/04 0400) Last BM Date: 12/19/2015  Intake/Output from previous day: 05/03 0701 - 05/04 0700 In: 1848.1 [I.V.:1270.1; Blood:398; NG/GT:30; IV Piggyback:150] Out: [id number] [Urine:105; Emesis/NG output:100; Drains:100; Blood:50] Intake/Output this shift: Total I/O In: 60 [I.V.:60] Out: -   General appearance: appears older than stated age, no distress and resting comfortably.  Not arousable. GI: Soft.  Nondistended.  Vac in place over midline.  Good suction on sponge.  Lab Results:   Recent Labs  01/04/2016 0400  WBC 17.7*  HGB 10.5*  HCT 31.3*  PLT 39*   BMET  Recent Labs  12/15/15 1500 12/26/2015 0400  NA 133* 134*  135  K 3.7 4.1  4.1  CL 106 103  104  CO2 17* 21*  21*  GLUCOSE 158* 89  89  BUN 64* 45*  45*  CREATININE 4.45* 3.34*  3.33*  CALCIUM 5.5* 6.5*  6.5*   PT/INR  Recent Labs  01/01/2016 0400  LABPROT 17.2*  INR 1.39   ABG  Recent Labs  12/29/2015 1146  PHART 7.326*  HCO3 20.2    Studies/Results: Dg Chest Port 1 View  01/10/2016  CLINICAL DATA:  Acute respiratory failure EXAM: PORTABLE CHEST 1 VIEW COMPARISON:  Portable chest x-ray of 12/15/2015 and CT of the chest of 11/14/2015 FINDINGS: Aeration of the lungs has improved somewhat. Bibasilar opacities remain consistent with atelectasis  some possible airspace disease as well as tiny effusions. Endotracheal tear in central venous lines are unchanged in position. Heart size is stable. IMPRESSION: 1. Slightly better aeration. 2. Little change in bibasilar opacities consistent with atelectasis, probable small effusions, at a possible airspace disease Electronically Signed   By: Ivar Drape M.D.   On: 01/05/2016 07:58   Dg Chest Port 1 View  12/15/2015  CLINICAL DATA:  Central catheter placement.  Hypoxia. EXAM: PORTABLE CHEST 1 VIEW COMPARISON:  Study obtained earlier in the day FINDINGS: Left-sided central catheter tip is in the left innominate vein. Right central catheter tip is in the superior vena cava. Nasogastric tube tip and side port are below the diaphragm. Endotracheal tube tip is 6.3 cm above the carina. No pneumothorax. There is underlying interstitial edema with patchy alveolar edema in the right lower lobe. The lungs elsewhere clear. Heart size and pulmonary vascularity are normal. No adenopathy. Postoperative changes noted in the left clavicle region, stable. IMPRESSION: Tube and catheter positions as described without pneumothorax. Note that the new central catheter tip is in the left innominate vein. There is a degree of underlying interstitial edema with patchy alveolar opacity in the right base. A degree of pneumonia in the right base must be of concern. Stable cardiac silhouette. Electronically Signed   By: Lowella Grip III M.D.   On: 12/15/2015 13:10    Anti-infectives: Anti-infectives  Start     Dose/Rate Route Frequency Ordered Stop   12/15/15 2200  ceFEPIme (MAXIPIME) 1 g in dextrose 5 % 50 mL IVPB  Status:  Discontinued     1 g 100 mL/hr over 30 Minutes Intravenous Every 12 hours 12/15/15 1346 01/06/2016 1216   12/25/2015 1600  fluconazole (DIFLUCAN) IVPB 200 mg  Status:  Discontinued     200 mg 100 mL/hr over 60 Minutes Intravenous Every 24 hours 12/13/15 1431 12/15/2015 1216   12/31/2015 1000  vancomycin  (VANCOCIN) IVPB 1000 mg/200 mL premix  Status:  Discontinued     1,000 mg 200 mL/hr over 60 Minutes Intravenous Every 24 hours 12/25/2015 0807 01/01/2016 1216   12/13/15 1530  fluconazole (DIFLUCAN) IVPB 400 mg     400 mg 100 mL/hr over 120 Minutes Intravenous  Once 12/13/15 1431 12/13/15 1830   12/13/15 1500  metroNIDAZOLE (FLAGYL) IVPB 500 mg  Status:  Discontinued     500 mg 100 mL/hr over 60 Minutes Intravenous Every 6 hours 12/13/15 1413 12/16/2015 1216   12/13/15 0900  vancomycin (VANCOCIN) IVPB 750 mg/150 ml premix  Status:  Discontinued     750 mg 150 mL/hr over 60 Minutes Intravenous Every 24 hours 11/25/2015 1455 12/13/15 1125   11/25/2015 1145  ceFEPIme (MAXIPIME) 1 g in dextrose 5 % 50 mL IVPB     1 g 100 mL/hr over 30 Minutes Intravenous STAT 11/30/2015 1136 12/13/2015 1150   12/08/2015 1000  ceFEPIme (MAXIPIME) 1 g in dextrose 5 % 50 mL IVPB  Status:  Discontinued     1 g 100 mL/hr over 30 Minutes Intravenous Every 24 hours 11/28/2015 0950 12/15/15 1346   12/09/2015 0900  vancomycin (VANCOCIN) IVPB 1000 mg/200 mL premix     1,000 mg 200 mL/hr over 60 Minutes Intravenous  Once 12/03/2015 0857 11/19/2015 1022      Assessment/Plan: s/p Procedure(s): EXPLORATORY LAPAROTOMY (N/A) SMALL BOWEL RESECTION WITH ANASTAMOSIS TIMES TWO APPLICATION OF INCISIONAL WOUND VAC (N/A) Transitioning to comfort care.  Awaiting floor bed with palliative care.  LOS: 5 days    Breanna Warren 12/22/2015

## 2016-01-14 NOTE — Progress Notes (Signed)
PULMONARY / CRITICAL CARE MEDICINE   Name: Breanna Warren MRN: [id number] DOB: Jun 14, 1946    ADMISSION DATE:  12/01/2015 CONSULTATION DATE:  11/24/2015  REFERRING MD:  Dr. Wyvonnia Dusky   CHIEF COMPLAINT:  Abdominal Pain    SUBJECTIVE:  Full comfort measures Actively dyting  Objective: Filed Vitals:   12/19/15 0500 Dec 19, 2015 0600 19-Dec-2015 0700 19-Dec-2015 0800  BP:      Pulse: 103 108 110 110  Temp:      TempSrc:      Resp: 6 7 6 6   Height:      Weight:      SpO2: 77% 78% 79% 76%   Gen: comfortable HENT: NCAT OP with dry mucus membranes PULM: Rhonchi bilaterally CV: RRR, no mgr Belly: no bowel sounds Neuro: sedated   Impression: Septic shock AKI COPD Peritonitis Bowel ischemia Short gut syndrome  Plan: Continue fentanyl drip Oral care Scopolamine patch for secretions Transfer to palliative medicine floor  Roselie Awkward, MD [facility] Pager: [phone] Cell: [phone] After 3pm or if no response, call [phone]

## 2016-01-14 DEATH — deceased

## 2018-01-14 IMAGING — CT CT CHEST W/O CM
4 of 5 series · 8 of 46 positions shown, 13 images · non-contrast
Comparison: 03/24/2009.  06/11/2007.

CLINICAL DATA: Abdominal pain. Chest pain. Hematemesis. Symptoms
for 1 day. Hypotension. Distended abdomen.

EXAM:
CT CHEST, ABDOMEN AND PELVIS WITHOUT CONTRAST
TECHNIQUE: Multidetector CT imaging of the chest, abdomen and pelvis was
performed following the standard protocol without IV contrast.

[Series 2: cap w/o 5.0 mm st · axial · non-contrast · 0.65mm/px · z∈[-975,-780]mm · 2 of 119 slices shown, 5 images]
[im 40/119  soft-tissue]
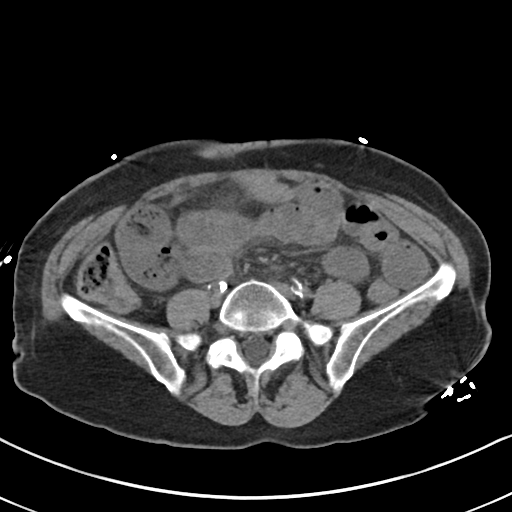
[im 40/119  lung]
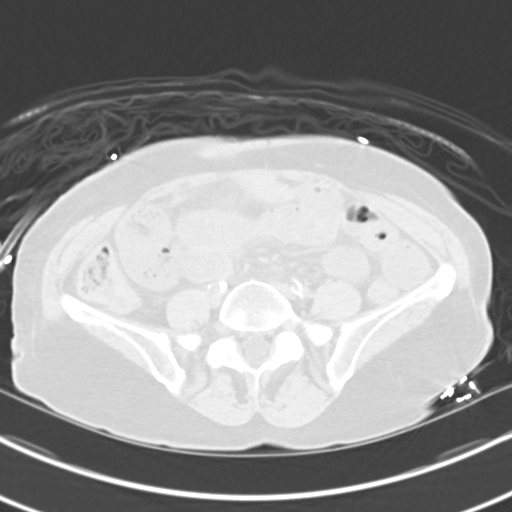
[im 40/119  bone]
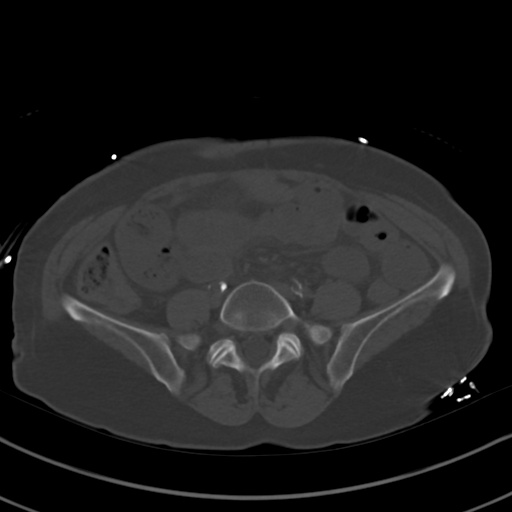
[im 79/119  soft-tissue]
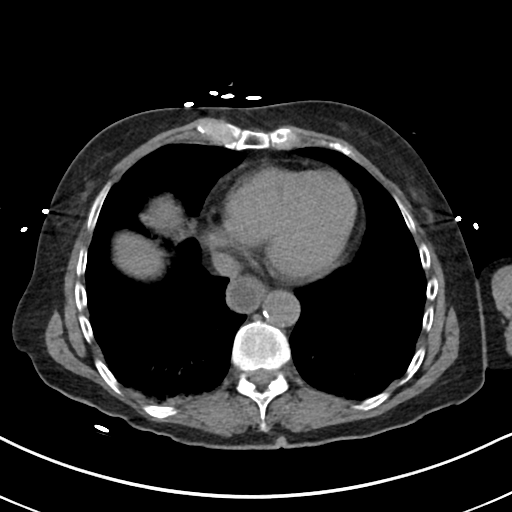
[im 79/119  lung]
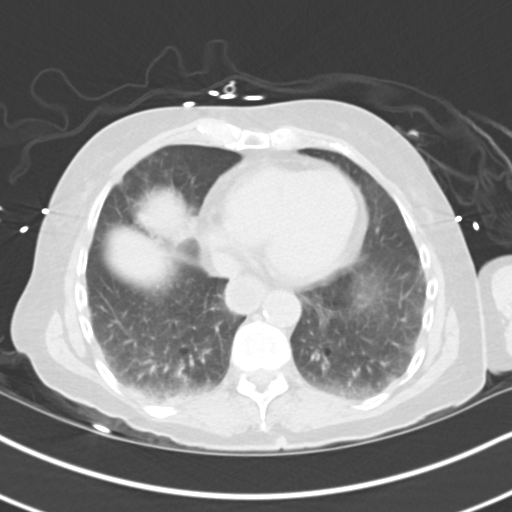

[Series 4: cap w/o 3.0 mm st cor · coronal · non-contrast · 0.68mm/px · 3 of 79 slices shown, 4 images]
[im 27/79  soft-tissue]
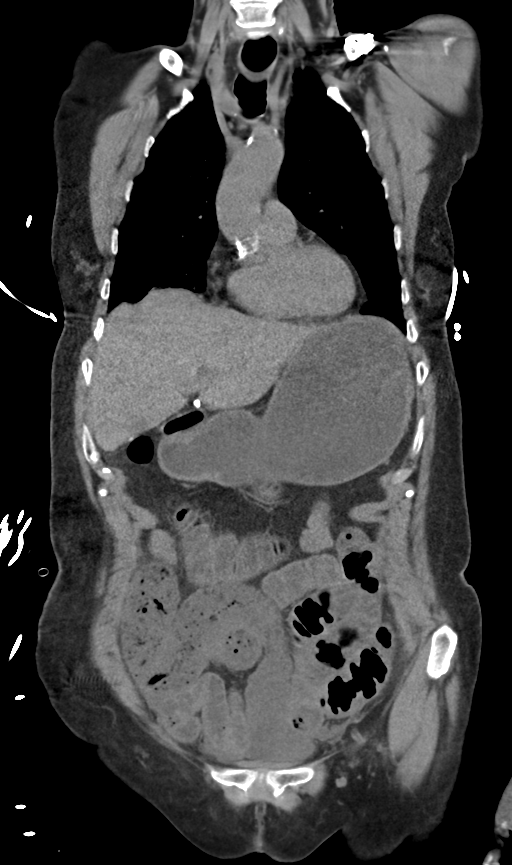
[im 35/79  soft-tissue]
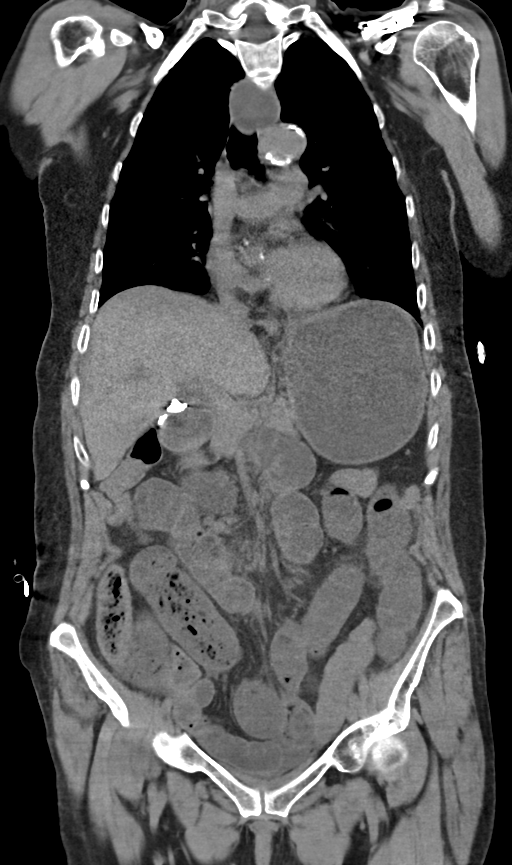
[im 35/79  bone]
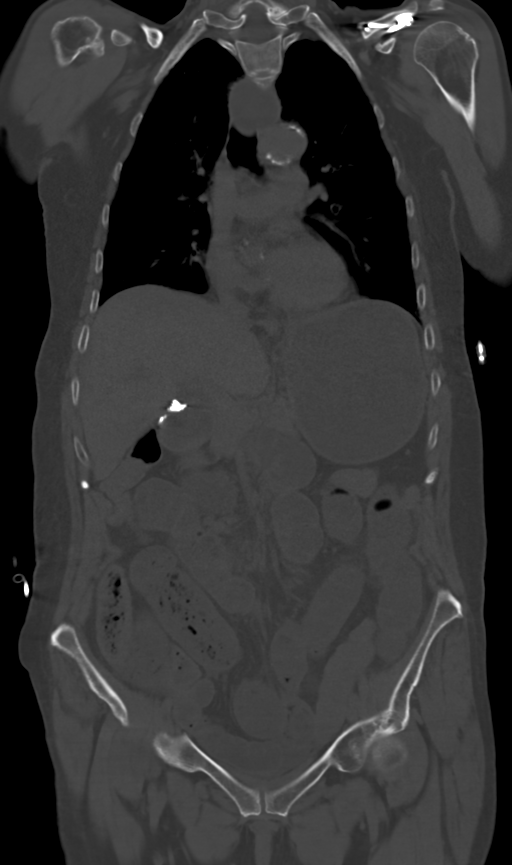
[im 44/79  soft-tissue]
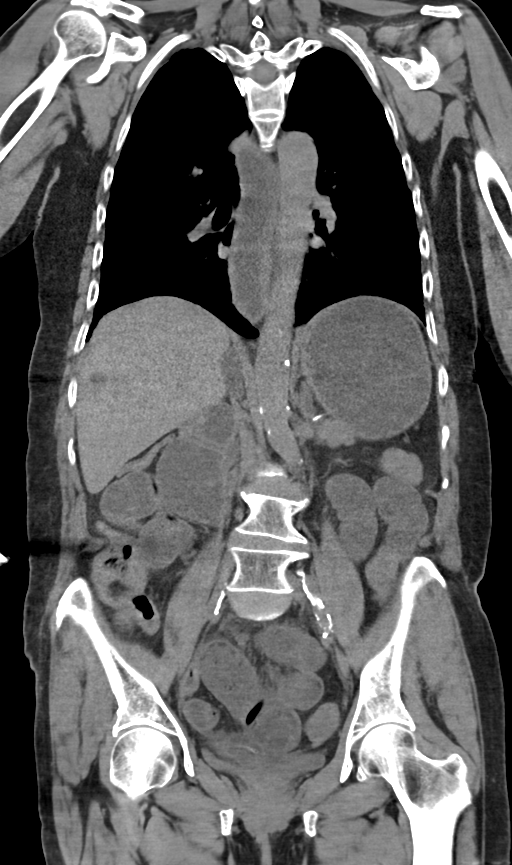

[Series 5: cap w/o 3.0 mm st sag · sagittal · non-contrast · 0.53mm/px · 1 of 112 slices shown, 2 images]
[im 38/112  soft-tissue]
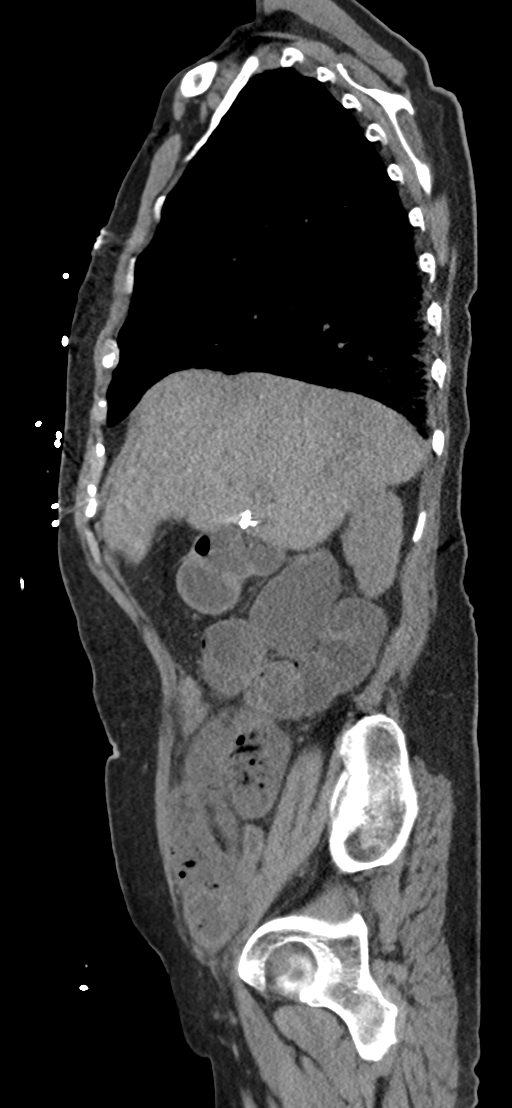
[im 38/112  bone]
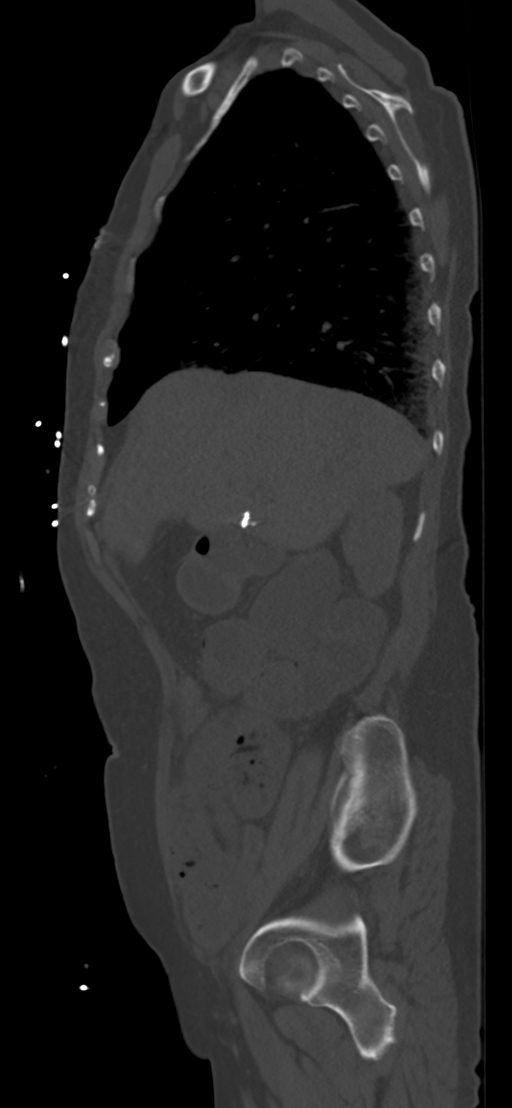

[Series 6: chest 1.0 mm st · axial · 0.65mm/px · z∈[-1119,-1042]mm · 2 of 739 slices shown]
[im 65/739  soft-tissue]
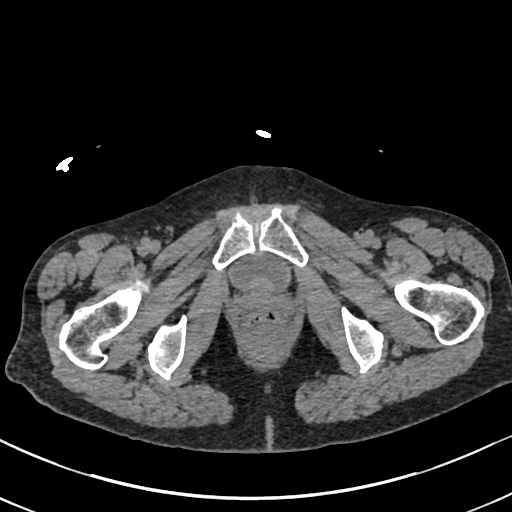
[im 161/739  soft-tissue]
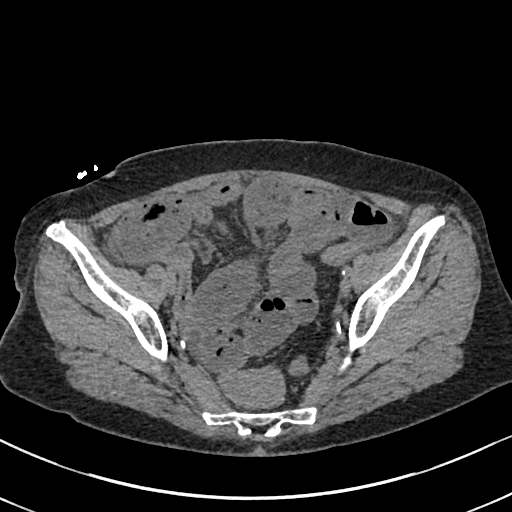

[8 of 46 positions shown; findings below may reference images not displayed]

FINDINGS: CT CHEST

Esophagus is distended with fluid. There is no esophageal mass. The
stomach is also distended.

There are atherosclerotic changes involving the aortic arch, great
vessels, and coronary arteries.

No pericardial effusion.  No abnormal mediastinal adenopathy.

Normal thyroid gland

ORIF left clavicle.  No breakage or loosening of the hardware.

Emphysema. Minimal dependent atelectasis in the left lower lobe.
Hazy airspace opacities at the base of the right middle and
dependent right lower lobe. There are some ground-glass opacities in
the medial left upper lobe. See image 74 anteriorly. There is deep
tendon hazy opacity in the left upper lobe. These findings likely
represent volume loss.

No pleural effusion.  No pneumothorax.

Mild T7 superior endplate wedge compression deformity, minimal
superior endplate depression at T10 and T11 are noted. There is no
prior imaging to correlate these fractures. There are
age-indeterminate. There are no definite acute fracture lines in
these have a chronic appearance. T12 compression fracture is stable
compared with prior imaging.

CT ABDOMEN AND PELVIS

Stomach, proximal small bowel, and mid small bowel are all
distended. Distal small bowel at in the region of the mid ileum is
also distended with feculent material. There is a transition point
to nondistended small bowel in the lower abdomen anteriorly just to
the right of midline on image 89. There is no obvious mass or
hernia. Distal small bowel and terminal ileum are decompressed.
There is some stool and gas in the colon.

No free intraperitoneal gas.  No free intraperitoneal fluid.

Small ventral hernia containing adipose tissue on image 69 is
stable.

There are hypodensities scattered throughout the liver. There is a
right lobe liver lesion on image 53 that has enlarged and now
measures 16 mm. Previously, it was less than 10 mm.

Postcholecystectomy

Calcified granulomata in the spleen.

Pancreas and left adrenal gland are unremarkable. Stable right
adrenal low-density adenoma.

Atherosclerotic changes of the aorta without evidence of aneurysm.

Bladder is decompressed. Uterus is absent. Adnexa are unremarkable.

Severe L3 compression deformity is stable. No other lumbar
compression fracture is identified. Mild facet arthropathy in the
lower lumbar spine.
IMPRESSION: Hazy and patchy airspace opacities in the lower right lung as
described. Considering the esophagus is dilated. Aspiration
pneumonia is not excluded. Hazy ground-glass in the left lung is
felt to be related to volume loss.

Partial small bowel obstruction pattern. The transition point is in
the right lower abdomen anteriorly in the mid ileum. No obvious
obstructing mass.

Enlarging right lobe liver lesion which measures 16 mm.
Contrast-enhanced MRI is recommended.

Multiple compression fractures are likely chronic. Fractures in the
lower thoracic spine and lumbar spine are stable. There is no prior
imaging for comparison of the mid thoracic spine compression
fractures, but they have a chronic appearance. MRI can be performed
to further delineate and better age fractures if desired.

Postoperative changes.

## 2018-01-14 IMAGING — CR DG CHEST 1V PORT
1 series · 1 of 1 positions shown · non-contrast
Comparison: Chest radiograph 01/19/2013.

CLINICAL DATA: Patient with abdominal pain and chest pain.

EXAM:
PORTABLE CHEST 1 VIEW

[AP]
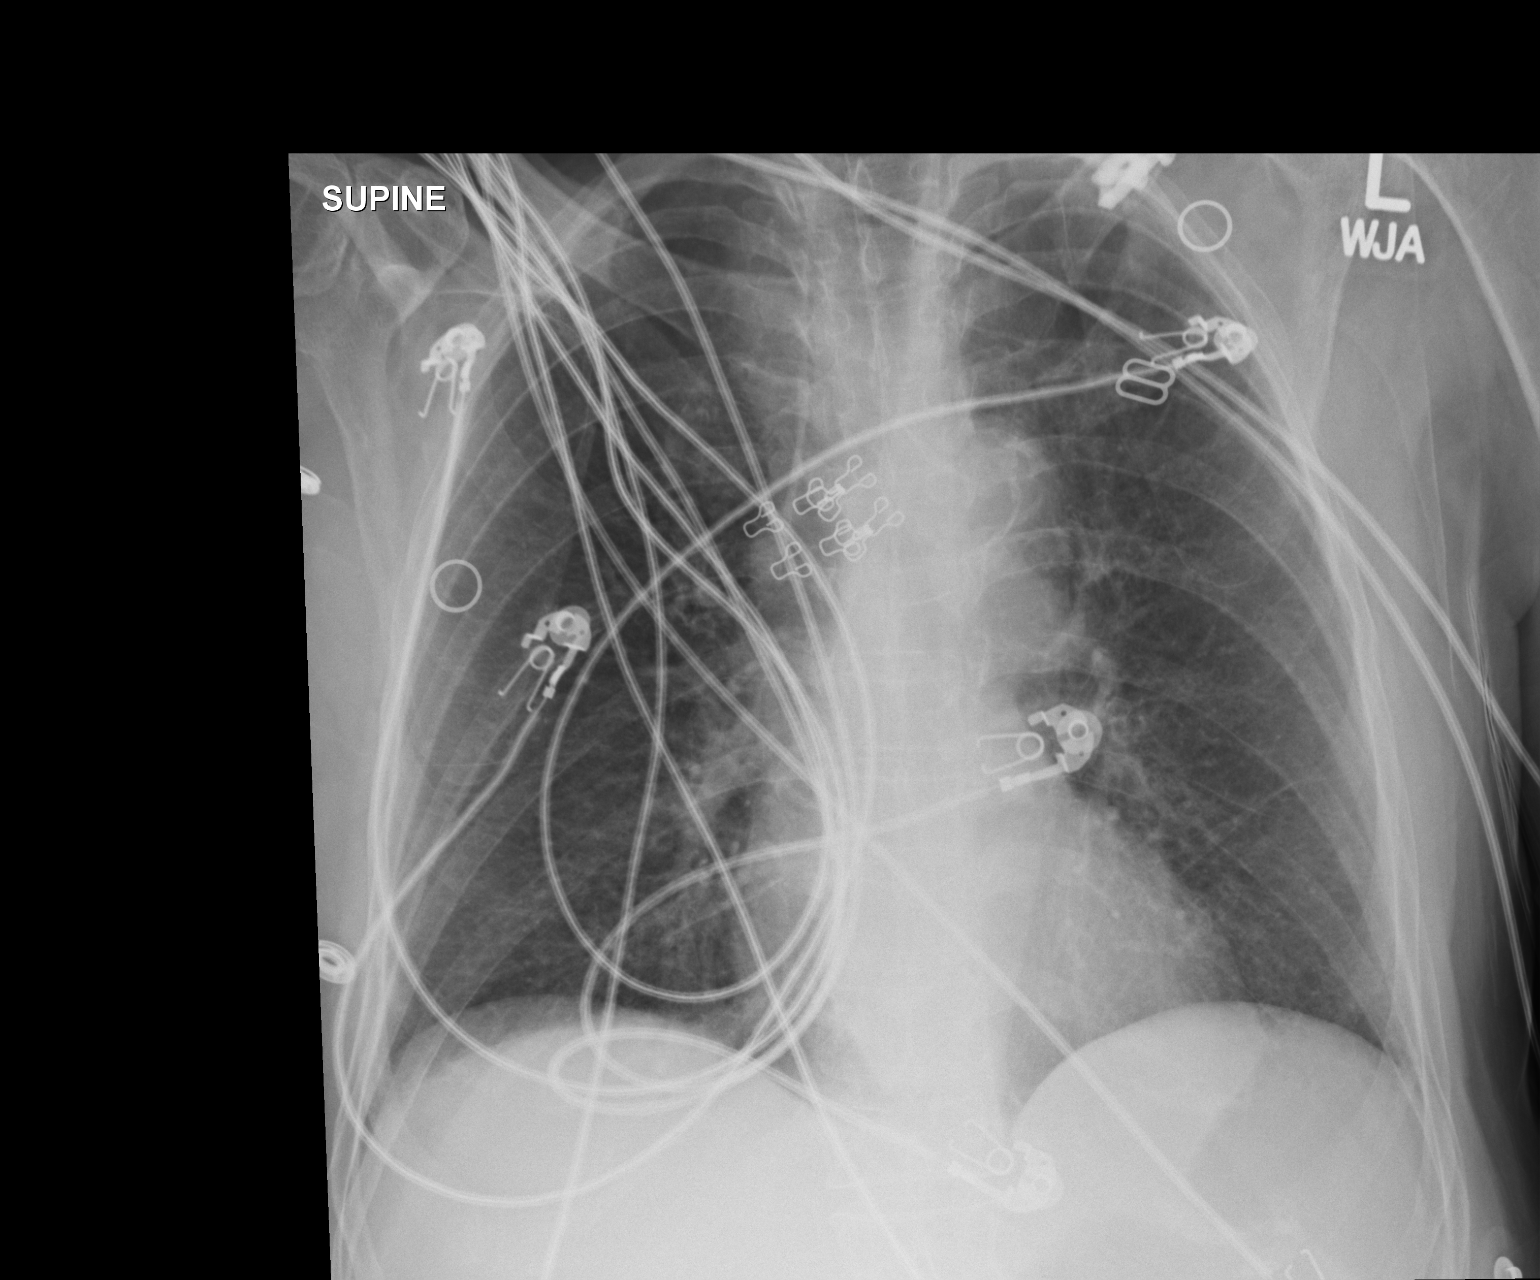

[1 of 1 positions shown; findings below may reference images not displayed]

FINDINGS: Multiple monitoring leads overlie the patient. Stable cardiac and
mediastinal contours. Postsurgical hardware left clavicle. No large
area of pulmonary consolidation. No pleural effusion pneumothorax.
IMPRESSION: Consider repeat evaluation with the patient's overlying lines
removed.

No acute cardiopulmonary process.

## 2018-01-16 IMAGING — CR DG CHEST 1V PORT
1 series · 1 of 1 positions shown · non-contrast
Comparison: 12/12/2015

CLINICAL DATA: Respiratory failure with hypoxemia

EXAM:
PORTABLE CHEST 1 VIEW

[AP]
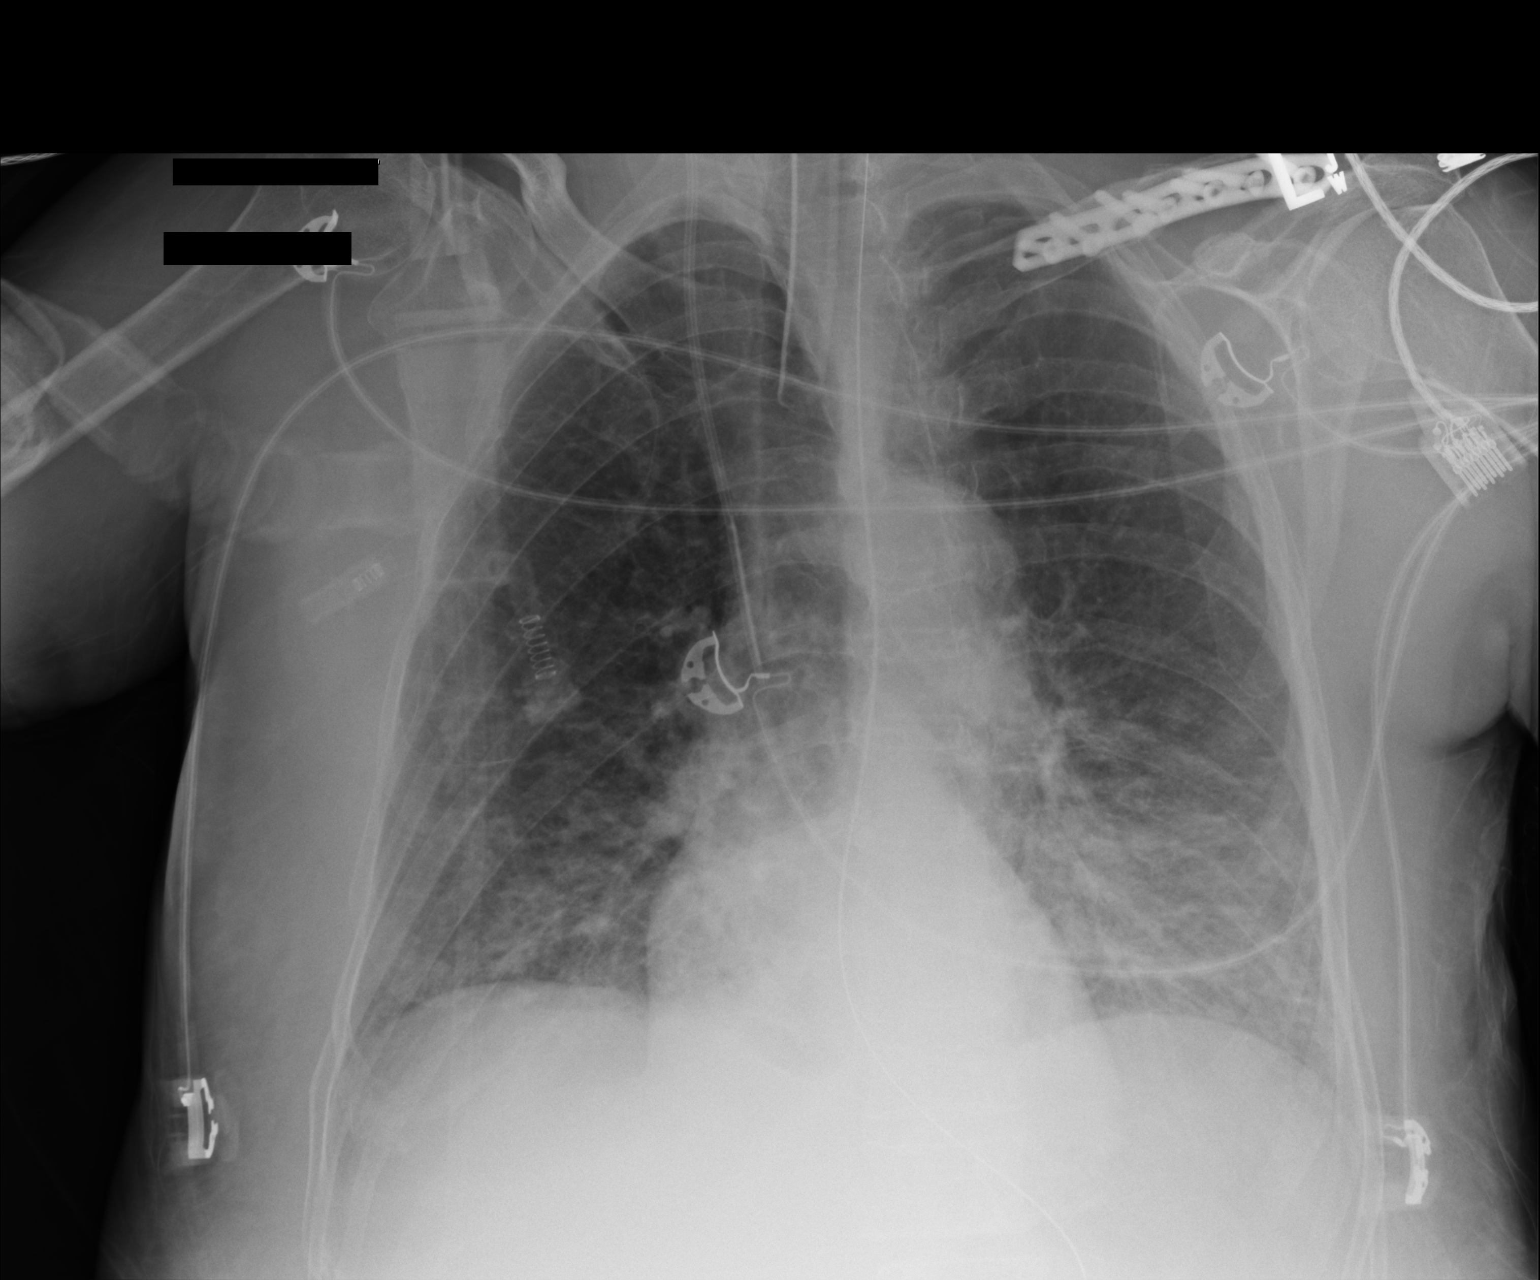

[1 of 1 positions shown; findings below may reference images not displayed]

FINDINGS: Cardiac shadow is stable. An endotracheal tube and nasogastric
catheter is well as a right jugular central line are again noted and
stable. Bibasilar infiltrative changes are seen left slightly
greater than right. No other focal abnormality is noted.
IMPRESSION: Bibasilar infiltrate

## 2018-01-18 IMAGING — CR DG CHEST 1V PORT
1 series · 1 of 1 positions shown · non-contrast
Comparison: Portable chest x-ray of 12/15/2015 and CT of the chest
of 12/12/2015

CLINICAL DATA: Acute respiratory failure

EXAM:
PORTABLE CHEST 1 VIEW

[AP]
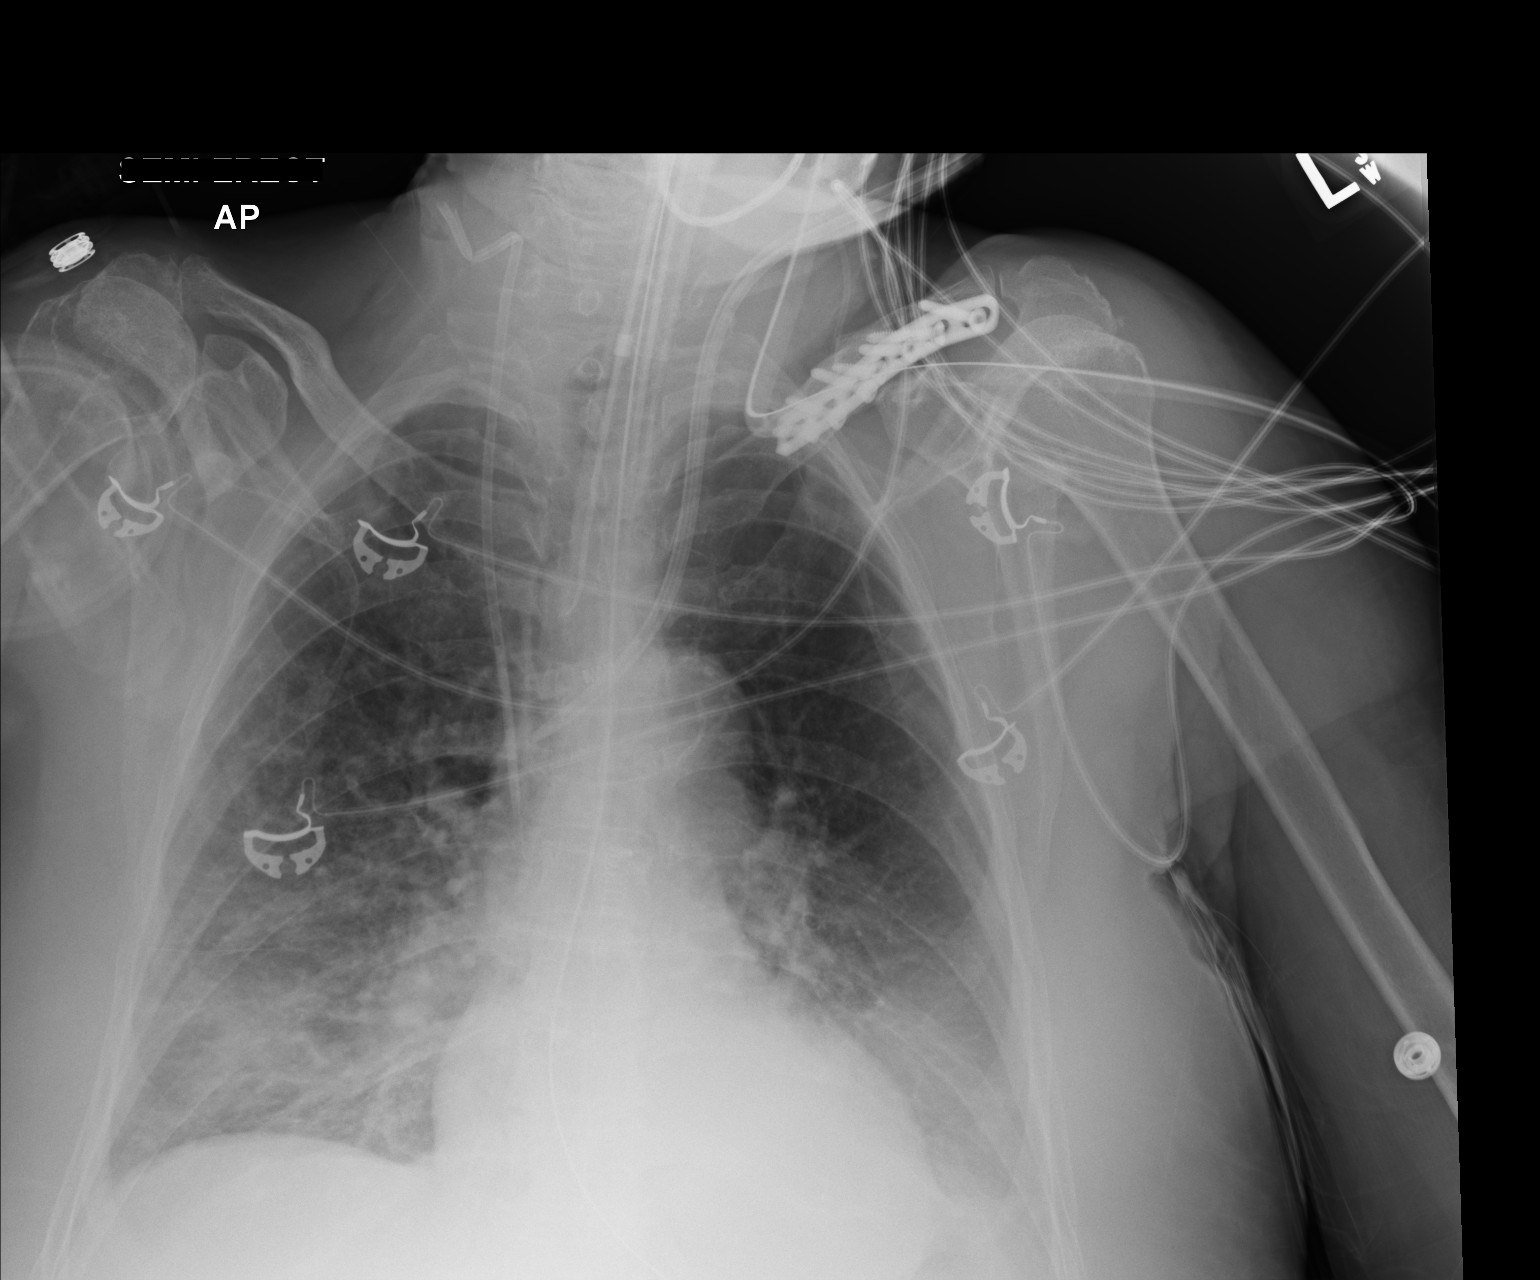

[1 of 1 positions shown; findings below may reference images not displayed]

FINDINGS: Aeration of the lungs has improved somewhat. Bibasilar opacities
remain consistent with atelectasis some possible airspace disease as
well as tiny effusions. Endotracheal tear in central venous lines
are unchanged in position. Heart size is stable.
IMPRESSION: 1. Slightly better aeration.
2. Little change in bibasilar opacities consistent with atelectasis,
probable small effusions, at a possible airspace disease
